# Patient Record
Sex: Female | Born: 1995 | Race: White | Hispanic: No | Marital: Single | Smoking: Former smoker
Health system: Southern US, Community
[De-identification: ages and names within clinical notes are randomized; demographics above are authoritative.]

## PROBLEM LIST (undated history)

## (undated) ENCOUNTER — Inpatient Hospital Stay (HOSPITAL_COMMUNITY): Payer: Self-pay

## (undated) DIAGNOSIS — IMO0002 Reserved for concepts with insufficient information to code with codable children: Secondary | ICD-10-CM

## (undated) DIAGNOSIS — F603 Borderline personality disorder: Secondary | ICD-10-CM

## (undated) DIAGNOSIS — Z915 Personal history of self-harm: Secondary | ICD-10-CM

## (undated) DIAGNOSIS — F419 Anxiety disorder, unspecified: Secondary | ICD-10-CM

## (undated) DIAGNOSIS — O139 Gestational [pregnancy-induced] hypertension without significant proteinuria, unspecified trimester: Secondary | ICD-10-CM

## (undated) DIAGNOSIS — Z8659 Personal history of other mental and behavioral disorders: Secondary | ICD-10-CM

## (undated) DIAGNOSIS — Z62810 Personal history of physical and sexual abuse in childhood: Secondary | ICD-10-CM

## (undated) DIAGNOSIS — F329 Major depressive disorder, single episode, unspecified: Secondary | ICD-10-CM

## (undated) DIAGNOSIS — Z87440 Personal history of urinary (tract) infections: Secondary | ICD-10-CM

## (undated) DIAGNOSIS — F32A Depression, unspecified: Secondary | ICD-10-CM

## (undated) DIAGNOSIS — N83209 Unspecified ovarian cyst, unspecified side: Secondary | ICD-10-CM

## (undated) DIAGNOSIS — Z87448 Personal history of other diseases of urinary system: Secondary | ICD-10-CM

## (undated) HISTORY — PX: NO PAST SURGERIES: SHX2092

## (undated) HISTORY — DX: Depression, unspecified: F32.A

## (undated) HISTORY — DX: Reserved for concepts with insufficient information to code with codable children: IMO0002

## (undated) HISTORY — PX: OTHER SURGICAL HISTORY: SHX169

## (undated) HISTORY — DX: Personal history of other diseases of urinary system: Z87.448

## (undated) HISTORY — DX: Personal history of other mental and behavioral disorders: Z86.59

## (undated) HISTORY — DX: Personal history of self-harm: Z91.5

## (undated) HISTORY — DX: Anxiety disorder, unspecified: F41.9

## (undated) HISTORY — DX: Personal history of urinary (tract) infections: Z87.440

## (undated) HISTORY — DX: Major depressive disorder, single episode, unspecified: F32.9

## (undated) HISTORY — DX: Gestational (pregnancy-induced) hypertension without significant proteinuria, unspecified trimester: O13.9

## (undated) HISTORY — DX: Borderline personality disorder: F60.3

## (undated) HISTORY — DX: Unspecified ovarian cyst, unspecified side: N83.209

---
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
## 2015-04-20 DIAGNOSIS — J208 Acute bronchitis due to other specified organisms: Secondary | ICD-10-CM | POA: Insufficient documentation

## 2015-04-20 DIAGNOSIS — J209 Acute bronchitis, unspecified: Secondary | ICD-10-CM | POA: Insufficient documentation

## 2016-02-28 ENCOUNTER — Encounter (HOSPITAL_COMMUNITY): Payer: Self-pay

## 2016-02-28 ENCOUNTER — Inpatient Hospital Stay (HOSPITAL_COMMUNITY): Payer: BLUE CROSS/BLUE SHIELD

## 2016-02-28 ENCOUNTER — Inpatient Hospital Stay (HOSPITAL_COMMUNITY)
Admission: AD | Admit: 2016-02-28 | Discharge: 2016-02-28 | Disposition: A | Discharge status: Home / Self Care | Payer: BLUE CROSS/BLUE SHIELD | Source: Ambulatory Visit | Attending: Obstetrics and Gynecology | Admitting: Obstetrics and Gynecology

## 2016-02-28 DIAGNOSIS — O200 Threatened abortion: Secondary | ICD-10-CM | POA: Insufficient documentation

## 2016-02-28 DIAGNOSIS — N939 Abnormal uterine and vaginal bleeding, unspecified: Secondary | ICD-10-CM | POA: Diagnosis present

## 2016-02-28 DIAGNOSIS — Z3A00 Weeks of gestation of pregnancy not specified: Secondary | ICD-10-CM | POA: Insufficient documentation

## 2016-02-28 DIAGNOSIS — O4690 Antepartum hemorrhage, unspecified, unspecified trimester: Secondary | ICD-10-CM

## 2016-02-28 LAB — HCG, QUANTITATIVE, PREGNANCY: HCG, BETA CHAIN, QUANT, S: 176 m[IU]/mL — AB (ref <5)

## 2016-02-28 LAB — POCT PREGNANCY, URINE: Preg Test, Ur: POSITIVE — AB

## 2016-02-28 LAB — URINALYSIS, ROUTINE W REFLEX MICROSCOPIC
Bilirubin Urine: NEGATIVE
Glucose, UA: NEGATIVE mg/dL
Ketones, ur: NEGATIVE mg/dL
NITRITE: NEGATIVE
Protein, ur: NEGATIVE mg/dL
SPECIFIC GRAVITY, URINE: 1.02 (ref 1.005–1.030)
pH: 5 (ref 5.0–8.0)

## 2016-02-28 NOTE — MAU Provider Note (Signed)
History   G1 early preg in with cramping and bleeding since yesterday. Denies any medical problems.  CSN: [id number]  Arrival date & time 02/28/16  1800   None     Chief Complaint  Patient presents with  . Vaginal Bleeding    HPI  No past medical history on file.  History reviewed. No pertinent surgical history.  Family History  Problem Relation Age of Onset  . Diabetes Father     Social History  Substance Use Topics  . Smoking status: Never Smoker  . Smokeless tobacco: Never Used  . Alcohol use No    OB History    Gravida Para Term Preterm AB Living   1             SAB TAB Ectopic Multiple Live Births                  Review of Systems  Constitutional: Negative.   HENT: Negative.   Eyes: Negative.   Respiratory: Negative.   Cardiovascular: Negative.   Gastrointestinal: Positive for abdominal pain.  Endocrine: Negative.   Genitourinary: Positive for vaginal bleeding.  Musculoskeletal: Negative.   Skin: Negative.   Allergic/Immunologic: Negative.   Neurological: Negative.   Hematological: Negative.   Psychiatric/Behavioral: Negative.     Allergies  Patient has no known allergies.  Home Medications    BP 122/56   Pulse 97   Temp 98.4 F (36.9 C) (Oral)   Resp 16   Ht 5\' 5"  (1.651 m)   Wt 192 lb (87.1 kg)   LMP 01/09/2016   BMI 31.95 kg/m   Physical Exam  Constitutional: She is oriented to person, place, and time. She appears well-developed and well-nourished.  HENT:  Head: Normocephalic.  Eyes: Pupils are equal, round, and reactive to light.  Neck: Normal range of motion.  Cardiovascular: Normal rate, regular rhythm, normal heart sounds and intact distal pulses.   Pulmonary/Chest: Effort normal and breath sounds normal.  Abdominal: Soft. Bowel sounds are normal.  Genitourinary: Vagina normal and uterus normal.  Musculoskeletal: Normal range of motion.  Neurological: She is alert and oriented to person, place, and time. She has normal  reflexes.  Skin: Skin is warm and dry.  Psychiatric: She has a normal mood and affect. Her behavior is normal. Judgment and thought content normal.    MAU Course  Procedures (including critical care time)  Labs Reviewed  POCT PREGNANCY, URINE - Abnormal; Notable for the following:       Result Value   Preg Test, Ur POSITIVE (*)    All other components within normal limits  URINALYSIS, ROUTINE W REFLEX MICROSCOPIC  HCG, QUANTITATIVE, PREGNANCY   No results found.   No diagnosis found.    MDM  Threaten abortion Scant amt bright vag bleeding. POC preg test pos. Quant 176 and vag probe us does not show definitive IUP but no ectopic noted at present. Will repeat quant Tuesday morning in clinic to assess viability. Will d/c home with ectopic anf threat ab precautions

## 2016-02-28 NOTE — MAU Note (Signed)
Had positive UPT 2 weeks ago, this morning started having bleeding like a period and passing clots having lower abdominal and lower back pain which she rates 6/10. LMP 01/09/16

## 2016-03-02 ENCOUNTER — Other Ambulatory Visit: Payer: BLUE CROSS/BLUE SHIELD

## 2016-03-03 ENCOUNTER — Other Ambulatory Visit: Payer: BLUE CROSS/BLUE SHIELD

## 2016-03-03 DIAGNOSIS — O039 Complete or unspecified spontaneous abortion without complication: Secondary | ICD-10-CM

## 2016-03-04 LAB — HCG, QUANTITATIVE, PREGNANCY: hCG, Beta Chain, Quant, S: 7.5 m[IU]/mL — ABNORMAL HIGH

## 2016-03-08 ENCOUNTER — Telehealth: Payer: Self-pay | Admitting: General Practice

## 2016-03-08 ENCOUNTER — Encounter: Payer: Self-pay | Admitting: General Practice

## 2016-03-08 NOTE — Telephone Encounter (Signed)
Per Dr Shawnie PonsPratt, bhcg confirms SAB. Called patient & informed her of results. Patient verbalized understanding & had no questions

## 2016-06-22 LAB — OB RESULTS CONSOLE ABO/RH: RH TYPE: POSITIVE

## 2016-06-22 LAB — OB RESULTS CONSOLE ANTIBODY SCREEN: Antibody Screen: NEGATIVE

## 2016-06-22 LAB — OB RESULTS CONSOLE RUBELLA ANTIBODY, IGM: Rubella: IMMUNE

## 2016-06-22 LAB — OB RESULTS CONSOLE GC/CHLAMYDIA
CHLAMYDIA, DNA PROBE: NEGATIVE
Gonorrhea: NEGATIVE

## 2016-06-22 LAB — OB RESULTS CONSOLE HIV ANTIBODY (ROUTINE TESTING): HIV: NONREACTIVE

## 2016-06-22 LAB — OB RESULTS CONSOLE RPR: RPR: NONREACTIVE

## 2016-06-22 LAB — OB RESULTS CONSOLE HEPATITIS B SURFACE ANTIGEN: HEP B S AG: NEGATIVE

## 2016-09-08 ENCOUNTER — Emergency Department (HOSPITAL_BASED_OUTPATIENT_CLINIC_OR_DEPARTMENT_OTHER)
Admission: EM | Admit: 2016-09-08 | Discharge: 2016-09-08 | Disposition: A | Discharge status: Home / Self Care | Payer: BLUE CROSS/BLUE SHIELD | Attending: Emergency Medicine | Admitting: Emergency Medicine

## 2016-09-08 ENCOUNTER — Encounter (HOSPITAL_BASED_OUTPATIENT_CLINIC_OR_DEPARTMENT_OTHER): Payer: Self-pay

## 2016-09-08 DIAGNOSIS — Y939 Activity, unspecified: Secondary | ICD-10-CM | POA: Insufficient documentation

## 2016-09-08 DIAGNOSIS — Y92000 Kitchen of unspecified non-institutional (private) residence as  the place of occurrence of the external cause: Secondary | ICD-10-CM | POA: Insufficient documentation

## 2016-09-08 DIAGNOSIS — S61211A Laceration without foreign body of left index finger without damage to nail, initial encounter: Secondary | ICD-10-CM | POA: Insufficient documentation

## 2016-09-08 DIAGNOSIS — W260XXA Contact with knife, initial encounter: Secondary | ICD-10-CM | POA: Diagnosis not present

## 2016-09-08 DIAGNOSIS — Y999 Unspecified external cause status: Secondary | ICD-10-CM | POA: Diagnosis not present

## 2016-09-08 DIAGNOSIS — S6992XA Unspecified injury of left wrist, hand and finger(s), initial encounter: Secondary | ICD-10-CM | POA: Diagnosis present

## 2016-09-08 MED ORDER — LIDOCAINE HCL (PF) 1 % IJ SOLN
5.0000 mL | Freq: Once | INTRAMUSCULAR | Status: DC
  Filled 2016-09-08: qty 5

## 2016-09-08 NOTE — ED Notes (Signed)
PMS intact before and after. Pt tolerated well. All questions answered. 

## 2016-09-08 NOTE — ED Triage Notes (Signed)
Pt states she cut left index finger with kitchen knife approx 15 min PTA-pt is [redacted] weeks pregnant

## 2016-09-08 NOTE — ED Provider Notes (Signed)
[facility] Provider Note   CSN: [id number] Arrival date & time: 09/08/16  2210  By signing my name below, I, Christina Freeman, attest that this documentation has been prepared under the direction and in the presence of [employer], PA-C. Electronically Signed: Linna Freeman, Scribe. 09/08/2016. 10:57 PM.  History   Chief Complaint Chief Complaint  Patient presents with  . Extremity Laceration   The history is provided by the patient. No language interpreter was used.    HPI Comments: Christina Freeman is a right-hand dominant 21 y.o. female who presents to the Emergency Department for evaluation of a left index finger laceration sustained around 930 PM tonight. She was cutting sunflower stems and inadvertently lacerated her left index finger with the knife. She endorses mild, throbbing pain secondary to the wound. The wound is hemostatic with an applied pressure dressing. No medications or treatments tried PTA. She is unsure of her tetanus status. She denies numbness/tingling, focal weakness, or any other associated symptoms. Patient is [redacted] weeks pregnant.   History reviewed. No pertinent past medical history.  There are no active problems to display for this patient.   History reviewed. No pertinent surgical history.  OB History    Gravida Para Term Preterm AB Living   2             SAB TAB Ectopic Multiple Live Births                   Home Medications    Prior to Admission medications   Not on File    Family History Family History  Problem Relation Age of Onset  . Diabetes Father     Social History Social History  Substance Use Topics  . Smoking status: Never Smoker  . Smokeless tobacco: Never Used  . Alcohol use No     Allergies   Patient has no known allergies.   Review of Systems Review of Systems  Skin: Positive for wound.  Neurological: Negative for weakness and numbness.  All other systems reviewed and are negative.  Physical  Exam Updated Vital Signs BP 118/74 (BP Location: Right Arm)   Pulse 88   Temp 98.3 F (36.8 C) (Oral)   Resp 20   Wt 91.1 kg (200 lb 13.4 oz)   LMP 01/09/2016   SpO2 100%   BMI 33.42 kg/m   Physical Exam  Constitutional: She appears well-developed and well-nourished.  HENT:  Head: Normocephalic and atraumatic.  Right Ear: External ear normal.  Left Ear: External ear normal.  Eyes: Conjunctivae are normal. Right eye exhibits no discharge. Left eye exhibits no discharge. No scleral icterus.  Cardiovascular:  Pulses:      Radial pulses are 2+ on the left side.  Pulmonary/Chest: Effort normal. No respiratory distress.  Musculoskeletal:       Left wrist: Normal.       Left hand: She exhibits normal range of motion and normal capillary refill. Normal sensation noted. Decreased sensation is not present in the ulnar distribution, is not present in the medial redistribution and is not present in the radial distribution. Normal strength noted.  Neurological: She is alert.  Skin: No pallor.  Left hand, second digit: There is a 1.5 cm incision over the PIP. Sensation is intact distally. 2+ radial pulse.  Psychiatric: She has a normal mood and affect.  Nursing note and vitals reviewed.  ED Treatments / Results  Labs (all labs ordered are listed, but only abnormal results are displayed) Labs  Reviewed - No data to display  EKG  EKG Interpretation None       Radiology No results found.  Procedures .Marland Kitchen.Laceration Repair Date/Time: 09/08/2016 11:32 PM Performed by: Jacinto HalimMACZIS, Terea Neubauer M Authorized by: Jacinto HalimMACZIS, Chabely Norby M   Consent:    Consent obtained:  Verbal   Consent given by:  Patient   Risks discussed:  Infection, need for additional repair, nerve damage, poor wound healing, poor cosmetic result, pain, retained foreign body, tendon damage and vascular damage   Alternatives discussed:  No treatment Anesthesia (see MAR for exact dosages):    Anesthesia method:  None Laceration  details:    Location:  Finger   Finger location:  L index finger   Length (cm):  1.5   Laceration depth: superficial  Repair type:    Repair type:  Simple Pre-procedure details:    Preparation:  Patient was prepped and draped in usual sterile fashion Exploration:    Hemostasis achieved with:  Direct pressure   Wound exploration: wound explored through full range of motion     Contaminated: no   Treatment:    Area cleansed with:  Saline   Amount of cleaning:  Standard   Irrigation solution:  Sterile saline   Irrigation volume:  50   Irrigation method:  Syringe   Visualized foreign bodies/material removed: no   Skin repair:    Repair method:  Tissue adhesive and Steri-Strips   Number of Steri-Strips:  3 Post-procedure details:    Dressing:  Splint for protection   Patient tolerance of procedure:  Tolerated well, no immediate complications   (including critical care time)  DIAGNOSTIC STUDIES: Oxygen Saturation is 100% on RA, normal by my interpretation.    COORDINATION OF CARE: 10:57 PM Discussed treatment plan with pt at bedside and pt agreed to plan.  Medications Ordered in ED Medications  lidocaine (PF) (XYLOCAINE) 1 % injection 5 mL (5 mLs Infiltration Not Given 09/08/16 2325)     Initial Impression / Assessment and Plan / ED Course  I have reviewed the triage vital signs and the nursing notes.  Pertinent labs & imaging results that were available during my care of the patient were reviewed by me and considered in my medical decision making (see chart for details).     Pressure irrigation performed. Wound explored and base of wound visualized in a bloodless field without evidence of foreign body.  Laceration superficial. Dermabond and steristrips applied. Fingersplint given for application. Laceration occurred < 8 hours prior to repair which was well tolerated. Patient wishes to wait for tetanus shot until she sees her OB/GYN.  Pt has no comorbidities to effect normal  wound healing. Pt discharged without antibiotics.  Discussed dermabond home care with patient and answered questions. Pt to follow-up for wound check in 7 days; they are to return to the ED sooner for signs of infection. Pt is hemodynamically stable with no complaints prior to dc.    Final Clinical Impressions(s) / ED Diagnoses   Final diagnoses:  Laceration of left index finger without foreign body without damage to nail, initial encounter    New Prescriptions There are no discharge medications for this patient.  I personally performed the services described in this documentation, which was scribed in my presence. The recorded information has been reviewed and is accurate.     Jacinto HalimMaczis, Fabian Walder M, PA-C 09/08/16 2351    Lavera GuiseLiu, Dana Duo, MD 09/09/16 0001

## 2016-09-08 NOTE — ED Notes (Signed)
ED Provider at bedside. 

## 2016-09-08 NOTE — Discharge Instructions (Signed)
Your laceration has been repaired with dermabond. The film will usually remain in place for 5-10 days, then naturally fall off your skin. Keep the bandaging dry. Replace the dressing daily until the adhesive film has fallen off or if the bandage should become wet. When changing the dressing, do not apply tape directly over the dermabond adhesive film as removing the tape later may also remove the film. Do not apply topical liquids or ointments to the area while the dermabond is in place. This may loosen the film. You may occasionally breifely wet your wound in a shower or bath. Do not soak or scrub your wound. Do not swim. Avoid periods of heavy perspiration. After showering, gently blot your wound dry with a soft towel and apply new clean bandage. Protect your wound from injury. Do not scratch, rub or pick at the Dermabond film.   SEEK MEDICAL CARE IF:  You have redness, swelling, or increasing pain in the wound.  You see a red line that goes away from the wound.  You have yellowish-white fluid (pus) coming from the wound.  You have a fever.  You notice a bad smell coming from the wound or dressing.  Your wound breaks open before or after sutures have been removed.  You notice something coming out of the wound such as wood or glass.  Your wound is on your hand or foot and you cannot move a finger or toe.  Your pain is not controlled with prescribed medicine.   If you did not receive a tetanus shot today because you thought you were up to date, but did not recall when your last one was given, make sure to check with your primary caregiver/OBGYN to determine if you need one.

## 2016-12-11 ENCOUNTER — Encounter (HOSPITAL_COMMUNITY): Payer: Self-pay

## 2016-12-11 ENCOUNTER — Inpatient Hospital Stay (HOSPITAL_COMMUNITY)
Admission: AD | Admit: 2016-12-11 | Discharge: 2016-12-11 | Disposition: A | Discharge status: Home / Self Care | Payer: BLUE CROSS/BLUE SHIELD | Source: Ambulatory Visit | Attending: Obstetrics and Gynecology | Admitting: Obstetrics and Gynecology

## 2016-12-11 DIAGNOSIS — Z8659 Personal history of other mental and behavioral disorders: Secondary | ICD-10-CM

## 2016-12-11 DIAGNOSIS — Z3A32 32 weeks gestation of pregnancy: Secondary | ICD-10-CM | POA: Insufficient documentation

## 2016-12-11 DIAGNOSIS — O26893 Other specified pregnancy related conditions, third trimester: Secondary | ICD-10-CM | POA: Diagnosis not present

## 2016-12-11 DIAGNOSIS — R102 Pelvic and perineal pain: Secondary | ICD-10-CM | POA: Diagnosis not present

## 2016-12-11 DIAGNOSIS — O26899 Other specified pregnancy related conditions, unspecified trimester: Secondary | ICD-10-CM

## 2016-12-11 DIAGNOSIS — N898 Other specified noninflammatory disorders of vagina: Secondary | ICD-10-CM

## 2016-12-11 DIAGNOSIS — O212 Late vomiting of pregnancy: Secondary | ICD-10-CM | POA: Insufficient documentation

## 2016-12-11 DIAGNOSIS — R112 Nausea with vomiting, unspecified: Secondary | ICD-10-CM

## 2016-12-11 LAB — URINALYSIS, ROUTINE W REFLEX MICROSCOPIC
Bilirubin Urine: NEGATIVE
GLUCOSE, UA: NEGATIVE mg/dL
Hgb urine dipstick: NEGATIVE
KETONES UR: NEGATIVE mg/dL
LEUKOCYTES UA: NEGATIVE
Nitrite: NEGATIVE
PROTEIN: NEGATIVE mg/dL
Specific Gravity, Urine: 1.016 (ref 1.005–1.030)
pH: 6 (ref 5.0–8.0)

## 2016-12-11 LAB — WET PREP, GENITAL
Clue Cells Wet Prep HPF POC: NONE SEEN
Sperm: NONE SEEN
Trich, Wet Prep: NONE SEEN
YEAST WET PREP: NONE SEEN

## 2016-12-11 LAB — POCT FERN TEST: POCT Fern Test: NEGATIVE

## 2016-12-11 MED ORDER — PROMETHAZINE HCL 25 MG PO TABS
25.0000 mg | ORAL_TABLET | Freq: Once | ORAL | Status: AC
  Administered 2016-12-11: 25 mg via ORAL
  Filled 2016-12-11: qty 1

## 2016-12-11 MED ORDER — PROMETHAZINE HCL 25 MG PO TABS: 25.0000 mg | ORAL_TABLET | Freq: Four times a day (QID) | ORAL | 2 refills | Status: DC | PRN

## 2016-12-11 NOTE — MAU Note (Signed)
Patient presents with vaginal pain, liquid discharge clear, denies itching or burning, vomiting x 2 today around 12:30

## 2016-12-11 NOTE — Discharge Instructions (Signed)
Preterm Labor and Birth Information The normal length of a pregnancy is 39-41 weeks. Preterm labor is when labor starts before 37 completed weeks of pregnancy. What are the risk factors for preterm labor? Preterm labor is more likely to occur in women who:  Have certain infections during pregnancy such as a bladder infection, sexually transmitted infection, or infection inside the uterus (chorioamnionitis).  Have a shorter-than-normal cervix.  Have gone into preterm labor before.  Have had surgery on their cervix.  Are younger than age 89 or older than age 19.  Are African American.  Are pregnant with twins or multiple babies (multiple gestation).  Take street drugs or smoke while pregnant.  Do not gain enough weight while pregnant.  Became pregnant shortly after having been pregnant.  What are the symptoms of preterm labor? Symptoms of preterm labor include:  Cramps similar to those that can happen during a menstrual period. The cramps may happen with diarrhea.  Pain in the abdomen or lower back.  Regular uterine contractions that may feel like tightening of the abdomen.  A feeling of increased pressure in the pelvis.  Increased watery or bloody mucus discharge from the vagina.  Water breaking (ruptured amniotic sac).  Why is it important to recognize signs of preterm labor? It is important to recognize signs of preterm labor because babies who are born prematurely may not be fully developed. This can put them at an increased risk for:  Long-term (chronic) heart and lung problems.  Difficulty immediately after birth with regulating body systems, including blood sugar, body temperature, heart rate, and breathing rate.  Bleeding in the brain.  Cerebral palsy.  Learning difficulties.  Death.  These risks are highest for babies who are born before 37 weeks of pregnancy. How is preterm labor treated? Treatment depends on the length of your pregnancy, your  condition, and the health of your baby. It may involve:  Having a stitch (suture) placed in your cervix to prevent your cervix from opening too early (cerclage).  Taking or being given medicines, such as: ? Hormone medicines. These may be given early in pregnancy to help support the pregnancy. ? Medicine to stop contractions. ? Medicines to help mature the babys lungs. These may be prescribed if the risk of delivery is high. ? Medicines to prevent your baby from developing cerebral palsy.  If the labor happens before 34 weeks of pregnancy, you may need to stay in the hospital. What should I do if I think I am in preterm labor? If you think that you are going into preterm labor, call your health care provider right away. How can I prevent preterm labor in future pregnancies? To increase your chance of having a full-term pregnancy:  Do not use any tobacco products, such as cigarettes, chewing tobacco, and e-cigarettes. If you need help quitting, ask your health care provider.  Do not use street drugs or medicines that have not been prescribed to you during your pregnancy.  Talk with your health care provider before taking any herbal supplements, even if you have been taking them regularly.  Make sure you gain a healthy amount of weight during your pregnancy.  Watch for infection. If you think that you might have an infection, get it checked right away.  Make sure to tell your health care provider if you have gone into preterm labor before.  This information is not intended to replace advice given to you by your health care provider. Make sure you discuss any questions  you have with your health care provider. Document Released: 05/01/2003 Document Revised: 07/22/2015 Document Reviewed: 07/02/2015 Elsevier Interactive Patient Education  2018 [employer] Nausea and Vomiting, Adult Nausea is the feeling that you have an upset stomach or have to vomit. As nausea gets worse, it can lead  to vomiting. Vomiting occurs when stomach contents are thrown up and out of the mouth. Vomiting can make you feel weak and cause you to become dehydrated. Dehydration can make you tired and thirsty, cause you to have a dry mouth, and decrease how often you urinate. Older adults and people with other diseases or a weak immune system are at higher risk for dehydration. It is important to treat your nausea and vomiting as told by your health care provider. Follow these instructions at home: Follow instructions from your health care provider about how to care for yourself at home. Eating and drinking Follow these recommendations as told by your health care provider:  Take an oral rehydration solution (ORS). This is a drink that is sold at pharmacies and retail stores.  Drink clear fluids in small amounts as you are able. Clear fluids include water, ice chips, diluted fruit juice, and low-calorie sports drinks.  Eat bland, easy-to-digest foods in small amounts as you are able. These foods include bananas, applesauce, rice, lean meats, toast, and crackers.  Avoid fluids that contain a lot of sugar or caffeine, such as energy drinks, sports drinks, and soda.  Avoid alcohol.  Avoid spicy or fatty foods.  General instructions  Drink enough fluid to keep your urine clear or pale yellow.  Wash your hands often. If soap and water are not available, use hand sanitizer.  Make sure that all people in your household wash their hands well and often.  Take over-the-counter and prescription medicines only as told by your health care provider.  Rest at home while you recover.  Watch your condition for any changes.  Breathe slowly and deeply when you feel nauseated.  Keep all follow-up visits as told by your health care provider. This is important. Contact a health care provider if:  You have a fever.  You cannot keep fluids down.  Your symptoms get worse.  You have new symptoms.  Your nausea  does not go away after two days.  You feel light-headed or dizzy.  You have a headache.  You have muscle cramps. Get help right away if:  You have pain in your chest, neck, arm, or jaw.  You feel extremely weak or you faint.  You have persistent vomiting.  You see blood in your vomit.  Your vomit looks like black coffee grounds.  You have bloody or black stools or stools that look like tar.  You have a severe headache, a stiff neck, or both.  You have a rash.  You have severe pain, cramping, or bloating in your abdomen.  You have trouble breathing or you are breathing very quickly.  Your heart is beating very quickly.  Your skin feels cold and clammy.  You feel confused.  You have pain when you urinate.  You have signs of dehydration, such as: ? Dark urine, very little urine, or no urine. ? Cracked lips. ? Dry mouth. ? Sunken eyes. ? Sleepiness. ? Weakness. These symptoms may represent a serious problem that is an emergency. Do not wait to see if the symptoms will go away. Get medical help right away. Call your local emergency services (911 in the [place] Do not drive yourself to  the hospital. This information is not intended to replace advice given to you by your health care provider. Make sure you discuss any questions you have with your health care provider. Document Released: 02/08/2005 Document Revised: 07/14/2015 Document Reviewed: 10/15/2014 Elsevier Interactive Patient Education  2017 [employer].

## 2016-12-11 NOTE — MAU Provider Note (Signed)
Chief Complaint:  Vaginal Discharge; Emesis; and Abdominal Pain   First Provider Initiated Contact with Patient 12/11/16 1423     HPI: Christina Freeman is a 21 y.o. G2P0 at [id number] presents to maternity admissions reporting vaginal pressure, clear liquid vaginal discharge, and two episodes of vomiting today.  Has not had vomiting since first trimester  Some intermittent pelvic cramps with tightening. She reports good fetal movement, denies LOF, vaginal bleeding, vaginal itching/burning, urinary symptoms, h/a, dizziness, diarrhea, constipation or fever/chills.    Vaginal Discharge  The patient's primary symptoms include vaginal discharge. The patient's pertinent negatives include no genital itching, genital lesions, genital odor, pelvic pain or vaginal bleeding. This is a new problem. The current episode started today. The problem occurs constantly. The problem has been unchanged. The patient is experiencing no pain. Associated symptoms include abdominal pain and vomiting. Pertinent negatives include no chills, constipation, diarrhea, fever or headaches. The vaginal discharge was clear and watery. There has been no bleeding. She has not been passing clots. She has not been passing tissue. Nothing aggravates the symptoms. She has tried nothing for the symptoms.  Emesis   This is a new problem. The current episode started today. The problem occurs less than 2 times per day. The problem has been resolved. Associated symptoms include abdominal pain. Pertinent negatives include no chest pain, chills, diarrhea, dizziness, fever or headaches. She has tried nothing for the symptoms.  Abdominal Pain  This is a new problem. The current episode started today. The onset quality is gradual. Episode frequency: not really pain, just pressure. The pain is located in the suprapubic region. The patient is experiencing no pain. The quality of the pain is dull. The abdominal pain does not radiate. Associated symptoms include  vomiting. Pertinent negatives include no constipation, diarrhea, fever or headaches. Nothing aggravates the pain. The pain is relieved by nothing. She has tried nothing for the symptoms.    RN Note: Patient presents with vaginal pain, liquid discharge clear, denies itching or burning, vomiting x 2 today around 12:30   Past Medical History: No past medical history on file.  Past obstetric history: OB History  Gravida Para Term Preterm AB Living  2            SAB TAB Ectopic Multiple Live Births               # Outcome Date GA Lbr Len/2nd Weight Sex Delivery Anes PTL Lv  2 Current           1 Gravida               Past Surgical History: No past surgical history on file.  Family History: Family History  Problem Relation Age of Onset  . Diabetes Father     Social History: Social History  Substance Use Topics  . Smoking status: Never Smoker  . Smokeless tobacco: Never Used  . Alcohol use No    Allergies: No Known Allergies  Meds:  No prescriptions prior to admission.    I have reviewed patient's Past Medical Hx, Surgical Hx, Family Hx, Social Hx, medications and allergies.   ROS:  Review of Systems  Constitutional: Negative for chills and fever.  Cardiovascular: Negative for chest pain.  Gastrointestinal: Positive for abdominal pain and vomiting. Negative for constipation and diarrhea.  Genitourinary: Positive for vaginal discharge. Negative for pelvic pain.  Neurological: Negative for dizziness and headaches.   Other systems negative  Physical Exam  Patient Vitals for the past 24  hrs:  BP Temp Pulse Resp Height Weight  12/11/16 1414 136/84 98.3 F (36.8 C) (!) 119 18 5\' 5"  (1.651 m) 223 lb (101.2 kg)   Constitutional: Well-developed, well-nourished female in no acute distress.  Cardiovascular: normal rate and rhythm Respiratory: normal effort, clear to auscultation bilaterally GI: Abd soft, non-tender, gravid appropriate for gestational age.   No rebound  or guarding. MS: Extremities nontender, no edema, normal ROM Neurologic: Alert and oriented x 4.  GU: Neg CVAT.  PELVIC EXAM: Cervix pink, visually closed, without lesion, scant white creamy discharge, vaginal walls and external genitalia normal Bimanual exam: Cervix firm, posterior, neg CMT, uterus nontender, Fundal Height consistent with dates, adnexa without tenderness, enlargement, or mass   Cervix closed/60-70/Ballotable    FHT:  Baseline 140 , moderate variability, accelerations present, no decelerations Contractions: Rare   Labs: Results for orders placed or performed during the hospital encounter of 12/11/16 (from the past 24 hour(s))  Urinalysis, Routine w reflex microscopic     Status: None   Collection Time: 12/11/16  2:17 PM  Result Value Ref Range   Color, Urine YELLOW YELLOW   APPearance CLEAR CLEAR   Specific Gravity, Urine 1.016 1.005 - 1.030   pH 6.0 5.0 - 8.0   Glucose, UA NEGATIVE NEGATIVE mg/dL   Hgb urine dipstick NEGATIVE NEGATIVE   Bilirubin Urine NEGATIVE NEGATIVE   Ketones, ur NEGATIVE NEGATIVE mg/dL   Protein, ur NEGATIVE NEGATIVE mg/dL   Nitrite NEGATIVE NEGATIVE   Leukocytes, UA NEGATIVE NEGATIVE  Wet prep, genital     Status: Abnormal   Collection Time: 12/11/16  2:43 PM  Result Value Ref Range   Yeast Wet Prep HPF POC NONE SEEN NONE SEEN   Trich, Wet Prep NONE SEEN NONE SEEN   Clue Cells Wet Prep HPF POC NONE SEEN NONE SEEN   WBC, Wet Prep HPF POC FEW (A) NONE SEEN   Sperm NONE SEEN   Fern Test     Status: None   Collection Time: 12/11/16  2:55 PM  Result Value Ref Range   POCT Fern Test Negative = intact amniotic membranes     Imaging:  No results found.  MAU Course/MDM: I have ordered labs and reviewed results. Urine is dilute, not suggestive of dehydration. Wet prep negative. Fern negative NST reviewed and found reactive, Category I Consult Dr Vincente Poli with presentation, exam findings and test results.  Treatments in MAU included EFM  and Phenergan, no vomiting here    Assessment: Single IUP at [redacted]w[redacted]d Pelvic pressure Vaginal discharge, physiologic Vomited twice, possible gastroenteritis  Plan: Discharge home Advance diet as tolerated Reassured Preterm Labor precautions and fetal kick counts Follow up in Office for prenatal visits and recheck of status Encouraged to return here or to other Urgent Care/ED if she develops worsening of symptoms, increase in pain, fever, or other concerning symptoms.   Pt stable at time of discharge.  Wynelle Bourgeois CNM, MSN Certified Nurse-Midwife 12/11/2016 2:24 PM

## 2017-01-01 ENCOUNTER — Encounter (HOSPITAL_COMMUNITY): Payer: Self-pay | Admitting: *Deleted

## 2017-01-01 ENCOUNTER — Inpatient Hospital Stay (HOSPITAL_COMMUNITY)
Admission: AD | Admit: 2017-01-01 | Discharge: 2017-01-01 | Disposition: A | Discharge status: Home / Self Care | Payer: BLUE CROSS/BLUE SHIELD | Source: Ambulatory Visit | Attending: Obstetrics and Gynecology | Admitting: Obstetrics and Gynecology

## 2017-01-01 DIAGNOSIS — Z3A15 15 weeks gestation of pregnancy: Secondary | ICD-10-CM

## 2017-01-01 DIAGNOSIS — Z3A35 35 weeks gestation of pregnancy: Secondary | ICD-10-CM | POA: Insufficient documentation

## 2017-01-01 DIAGNOSIS — O133 Gestational [pregnancy-induced] hypertension without significant proteinuria, third trimester: Secondary | ICD-10-CM | POA: Insufficient documentation

## 2017-01-01 DIAGNOSIS — H538 Other visual disturbances: Secondary | ICD-10-CM | POA: Diagnosis present

## 2017-01-01 LAB — COMPREHENSIVE METABOLIC PANEL
ALT: 10 U/L — ABNORMAL LOW (ref 14–54)
AST: 16 U/L (ref 15–41)
Albumin: 2.8 g/dL — ABNORMAL LOW (ref 3.5–5.0)
Alkaline Phosphatase: 113 U/L (ref 38–126)
Anion gap: 9 (ref 5–15)
BUN: 8 mg/dL (ref 6–20)
CHLORIDE: 105 mmol/L (ref 101–111)
CO2: 22 mmol/L (ref 22–32)
Calcium: 8.5 mg/dL — ABNORMAL LOW (ref 8.9–10.3)
Creatinine, Ser: 0.56 mg/dL (ref 0.44–1.00)
GFR calc Af Amer: >60 mL/min (ref >60)
GFR calc non Af Amer: >60 mL/min (ref >60)
GLUCOSE: 99 mg/dL (ref 65–99)
Potassium: 3.9 mmol/L (ref 3.5–5.1)
Sodium: 136 mmol/L (ref 135–145)
Total Bilirubin: 0.2 mg/dL — ABNORMAL LOW (ref 0.3–1.2)
Total Protein: 6.7 g/dL (ref 6.5–8.1)

## 2017-01-01 LAB — URINALYSIS, ROUTINE W REFLEX MICROSCOPIC
BILIRUBIN URINE: NEGATIVE
Glucose, UA: NEGATIVE mg/dL
Hgb urine dipstick: NEGATIVE
Ketones, ur: NEGATIVE mg/dL
LEUKOCYTES UA: NEGATIVE
NITRITE: NEGATIVE
PH: 6 (ref 5.0–8.0)
Protein, ur: NEGATIVE mg/dL
Specific Gravity, Urine: 1.013 (ref 1.005–1.030)

## 2017-01-01 LAB — PROTEIN / CREATININE RATIO, URINE
Creatinine, Urine: 81 mg/dL
PROTEIN CREATININE RATIO: 0.1 mg/mg{creat} (ref 0.00–0.15)
Total Protein, Urine: 8 mg/dL

## 2017-01-01 LAB — CBC
HCT: 37.6 % (ref 36.0–46.0)
HEMOGLOBIN: 11.9 g/dL — AB (ref 12.0–15.0)
MCH: 28 pg (ref 26.0–34.0)
MCHC: 31.6 g/dL (ref 30.0–36.0)
MCV: 88.5 fL (ref 78.0–100.0)
PLATELETS: 256 10*3/uL (ref 150–400)
RBC: 4.25 MIL/uL (ref 3.87–5.11)
RDW: 14.9 % (ref 11.5–15.5)
WBC: 9.7 10*3/uL (ref 4.0–10.5)

## 2017-01-01 MED ORDER — NIFEDIPINE 10 MG PO CAPS
10.0000 mg | ORAL_CAPSULE | ORAL | Status: DC | PRN
  Administered 2017-01-01: 10 mg via ORAL
  Filled 2017-01-01: qty 1

## 2017-01-01 MED ORDER — LABETALOL HCL 5 MG/ML IV SOLN: 40.0000 mg | Freq: Once | INTRAVENOUS | Status: DC | PRN

## 2017-01-01 MED ORDER — HYDRALAZINE HCL 20 MG/ML IJ SOLN: 10.0000 mg | Freq: Once | INTRAMUSCULAR | Status: DC | PRN

## 2017-01-01 MED ORDER — LABETALOL HCL 5 MG/ML IV SOLN: 20.0000 mg | INTRAVENOUS | Status: DC | PRN

## 2017-01-01 MED ORDER — ACETAMINOPHEN 325 MG PO TABS
650.0000 mg | ORAL_TABLET | Freq: Once | ORAL | Status: AC
  Administered 2017-01-01: 650 mg via ORAL
  Filled 2017-01-01: qty 2

## 2017-01-01 NOTE — MAU Note (Signed)
Pt had prenatal appointment on Thursday, Her blood pressure was elevated. They drew lab work and gave her s/s to look for. She is c/o now of headache, blurred vision, SOB and nausea. Also c/o abd pain (contractions). Fetal movement a little less than normal. Has not taken anything for headache.

## 2017-01-01 NOTE — Discharge Instructions (Signed)
Hypertension During Pregnancy °Hypertension, commonly called high blood pressure, is when the force of blood pumping through your arteries is too strong. Arteries are blood vessels that carry blood from the heart throughout the body. Hypertension during pregnancy can cause problems for you and your baby. Your baby may be born early (prematurely) or may not weigh as much as he or she should at birth. Very bad cases of hypertension during pregnancy can be life-threatening. °Different types of hypertension can occur during pregnancy. These include: °· Chronic hypertension. This happens when: °? You have hypertension before pregnancy and it continues during pregnancy. °? You develop hypertension before you are [redacted] weeks pregnant, and it continues during pregnancy. °· Gestational hypertension. This is hypertension that develops after the 20th week of pregnancy. °· Preeclampsia, also called toxemia of pregnancy. This is a very serious type of hypertension that develops only during pregnancy. It affects the whole body, and it can be very dangerous for you and your baby. ° °Gestational hypertension and preeclampsia usually go away within 6 weeks after your baby is born. Women who have hypertension during pregnancy have a greater chance of developing hypertension later in life or during future pregnancies. °What are the causes? °The exact cause of hypertension is not known. °What increases the risk? °There are certain factors that make it more likely for you to develop hypertension during pregnancy. These include: °· Having hypertension during a previous pregnancy or prior to pregnancy. °· Being overweight. °· Being older than age 40. °· Being pregnant for the first time or being pregnant with more than one baby. °· Becoming pregnant using fertilization methods such as IVF (in vitro fertilization). °· Having diabetes, kidney problems, or systemic lupus erythematosus. °· Having a family history of hypertension. ° °What are the  signs or symptoms? °Chronic hypertension and gestational hypertension rarely cause symptoms. Preeclampsia causes symptoms, which may include: °· Increased protein in your urine. Your health care provider will check for this at every visit before you give birth (prenatal visit). °· Severe headaches. °· Sudden weight gain. °· Swelling of the hands, face, legs, and feet. °· Nausea and vomiting. °· Vision problems, such as blurred or double vision. °· Numbness in the face, arms, legs, and feet. °· Dizziness. °· Slurred speech. °· Sensitivity to bright lights. °· Abdominal pain. °· Convulsions. ° °How is this diagnosed? °You may be diagnosed with hypertension during a routine prenatal exam. At each prenatal visit, you may: °· Have a urine test to check for high amounts of protein in your urine. °· Have your blood pressure checked. A blood pressure reading is recorded as two numbers, such as "120 over 80" (or 120/80). The first ("top") number is called the systolic pressure. It is a measure of the pressure in your arteries when your heart beats. The second ("bottom") number is called the diastolic pressure. It is a measure of the pressure in your arteries as your heart relaxes between beats. Blood pressure is measured in a unit called mm Hg. A normal blood pressure reading is: °? Systolic: below 120. °? Diastolic: below 80. ° °The type of hypertension that you are diagnosed with depends on your test results and when your symptoms developed. °· Chronic hypertension is usually diagnosed before 20 weeks of pregnancy. °· Gestational hypertension is usually diagnosed after 20 weeks of pregnancy. °· Hypertension with high amounts of protein in the urine is diagnosed as preeclampsia. °· Blood pressure measurements that stay above 160 systolic, or above 110 diastolic, are   signs of severe preeclampsia. ° °How is this treated? °Treatment for hypertension during pregnancy varies depending on the type of hypertension you have and how  serious it is. °· If you take medicines called ACE inhibitors to treat chronic hypertension, you may need to switch medicines. ACE inhibitors should not be taken during pregnancy. °· If you have gestational hypertension, you may need to take blood pressure medicine. °· If you are at risk for preeclampsia, your health care provider may recommend that you take a low-dose aspirin every day to prevent high blood pressure during your pregnancy. °· If you have severe preeclampsia, you may need to be hospitalized so you and your baby can be monitored closely. You may also need to take medicine (magnesium sulfate) to prevent seizures and to lower blood pressure. This medicine may be given as an injection or through an IV tube. °· In some cases, if your condition gets worse, you may need to deliver your baby early. ° °Follow these instructions at home: °Eating and drinking °· Drink enough fluid to keep your urine clear or pale yellow. °· Eat a healthy diet that is low in salt (sodium). Do not add salt to your food. Check food labels to see how much sodium a food or beverage contains. °Lifestyle °· Do not use any products that contain nicotine or tobacco, such as cigarettes and e-cigarettes. If you need help quitting, ask your health care provider. °· Do not use alcohol. °· Avoid caffeine. °· Avoid stress as much as possible. Rest and get plenty of sleep. °General instructions °· Take over-the-counter and prescription medicines only as told by your health care provider. °· While lying down, lie on your left side. This keeps pressure off your baby. °· While sitting or lying down, raise (elevate) your feet. Try putting some pillows under your lower legs. °· Exercise regularly. Ask your health care provider what kinds of exercise are best for you. °· Keep all prenatal and follow-up visits as told by your health care provider. This is important. °Contact a health care provider if: °· You have symptoms that your health care  provider told you may require more treatment or monitoring, such as: °? Fever. °? Vomiting. °? Headache. °Get help right away if: °· You have severe abdominal pain or vomiting that does not get better with treatment. °· You suddenly develop swelling in your hands, ankles, or face. °· You gain 4 lbs (1.8 kg) or more in 1 week. °· You develop vaginal bleeding, or you have blood in your urine. °· You do not feel your baby moving as much as usual. °· You have blurred or double vision. °· You have muscle twitching or sudden tightening (spasms). °· You have shortness of breath. °· Your lips or fingernails turn blue. °This information is not intended to replace advice given to you by your health care provider. Make sure you discuss any questions you have with your health care provider. °Document Released: 10/27/2010 Document Revised: 08/29/2015 Document Reviewed: 07/25/2015 °Elsevier Interactive Patient Education © 2018 [employer] ° °

## 2017-01-01 NOTE — MAU Provider Note (Signed)
Chief Complaint:  Headache; Blurred Vision; Shortness of Breath; and Abdominal Pain   First Provider Initiated Contact with Patient 01/01/17 1654     HPI: Christina Freeman is a 21 y.o. G2P0010 at 4835w6dwho presents to maternity admissions reporting hypertension, headache since Thursday, and blurred vision since yesterday.  Has had RUQ pain since 25 weeks.  .She reports good fetal movement, denies LOF, vaginal bleeding, vaginal itching/burning, urinary symptoms, h/a, dizziness, n/v, diarrhea, constipation or fever/chills.    Headache   This is a recurrent problem. The current episode started in the past 7 days. The problem occurs constantly. The problem has been unchanged. The quality of the pain is described as aching. Associated symptoms include abdominal pain and blurred vision. Pertinent negatives include no back pain, dizziness, fever, nausea or photophobia. Nothing aggravates the symptoms. She has tried nothing for the symptoms.  Abdominal Pain  This is a recurrent problem. The current episode started more than 1 month ago. The onset quality is gradual. The problem occurs intermittently. The pain is located in the RUQ. Pertinent negatives include no constipation, diarrhea, dysuria, fever, myalgias or nausea. Nothing aggravates the pain. The pain is relieved by nothing. She has tried nothing for the symptoms.    RN Note: Pt had prenatal appointment on Thursday, Her blood pressure was elevated. They drew lab work and gave her s/s to look for. She is c/o now of headache, blurred vision, SOB and nausea. Also c/o abd pain (contractions). Fetal movement a little less than normal. Has not taken anything for headache.    Past Medical History: History reviewed. No pertinent past medical history.  Past obstetric history: OB History  Gravida Para Term Preterm AB Living  2       1    SAB TAB Ectopic Multiple Live Births  1            # Outcome Date GA Lbr Len/2nd Weight Sex Delivery Anes PTL Lv  2  Current           1 SAB 03/13/16              Past Surgical History: Past Surgical History:  Procedure Laterality Date  . NO PAST SURGERIES    . tubes in ears      Family History: Family History  Problem Relation Age of Onset  . Diabetes Father     Social History: Social History   Tobacco Use  . Smoking status: Never Smoker  . Smokeless tobacco: Never Used  Substance Use Topics  . Alcohol use: No  . Drug use: No    Allergies: No Known Allergies  Meds:  Medications Prior to Admission  Medication Sig Dispense Refill Last Dose  . calcium carbonate (TUMS - DOSED IN MG ELEMENTAL CALCIUM) 500 MG chewable tablet Chew 2 tablets 2 (two) times daily as needed by mouth for indigestion or heartburn.   Past Week at Unknown time  . promethazine (PHENERGAN) 25 MG tablet Take 1 tablet (25 mg total) by mouth every 6 (six) hours as needed for nausea or vomiting. (Patient not taking: Reported on 01/01/2017) 30 tablet 2 Not Taking at Unknown time    I have reviewed patient's Past Medical Hx, Surgical Hx, Family Hx, Social Hx, medications and allergies.   ROS:  Review of Systems  Constitutional: Negative for fever.  Eyes: Positive for blurred vision. Negative for photophobia.  Gastrointestinal: Positive for abdominal pain. Negative for constipation, diarrhea and nausea.  Genitourinary: Negative for dysuria.  Musculoskeletal: Negative  for back pain and myalgias.  Neurological: Negative for dizziness.   Other systems negative  Physical Exam   Patient Vitals for the past 24 hrs:  BP Temp Pulse Resp  01/01/17 1649 (!) 139/91 - (!) 125 -  01/01/17 1639 (!) 138/91 98.4 F (36.9 C) (!) 135 18   Vitals:   01/01/17 1639 01/01/17 1649 01/01/17 1701 01/01/17 1716  BP: (!) 138/91 (!) 139/91 (!) 149/82 (!) 166/105  Pulse: (!) 135 (!) 125 (!) 119 (!) 122  Resp: 18     Temp: 98.4 F (36.9 C)      Vitals:   01/01/17 1746 01/01/17 1801 01/01/17 1816 01/01/17 1830  BP: (!) 152/84 (!)  152/90 (!) 154/89 (!) 154/89  Pulse: (!) 109 (!) 115 (!) 118   Resp:      Temp:        Constitutional: Well-developed, well-nourished female in no acute distress.  Cardiovascular: normal rate and rhythm Respiratory: normal effort, clear to auscultation bilaterally GI: Abd soft, non-tender, gravid appropriate for gestational age.   No rebound or guarding. MS: Extremities nontender, Trace edema, normal ROM Neurologic: Alert and oriented x 4. DTRs brisk, no clonus GU: Neg CVAT.  PELVIC EXAM: deferred  FHT:  Baseline 150 , moderate variability, accelerations present, no decelerations Contractions:  Irregular, infrequent, mild   Labs:    Results for orders placed or performed during the hospital encounter of 01/01/17 (from the past 24 hour(s))  Urinalysis, Routine w reflex microscopic     Status: None   Collection Time: 01/01/17  4:30 PM  Result Value Ref Range   Color, Urine YELLOW YELLOW   APPearance CLEAR CLEAR   Specific Gravity, Urine 1.013 1.005 - 1.030   pH 6.0 5.0 - 8.0   Glucose, UA NEGATIVE NEGATIVE mg/dL   Hgb urine dipstick NEGATIVE NEGATIVE   Bilirubin Urine NEGATIVE NEGATIVE   Ketones, ur NEGATIVE NEGATIVE mg/dL   Protein, ur NEGATIVE NEGATIVE mg/dL   Nitrite NEGATIVE NEGATIVE   Leukocytes, UA NEGATIVE NEGATIVE  Protein / creatinine ratio, urine     Status: None   Collection Time: 01/01/17  4:30 PM  Result Value Ref Range   Creatinine, Urine 81.00 mg/dL   Total Protein, Urine 8 mg/dL   Protein Creatinine Ratio 0.10 0.00 - 0.15 mg/mg[Cre]  CBC     Status: Abnormal   Collection Time: 01/01/17  5:05 PM  Result Value Ref Range   WBC 9.7 4.0 - 10.5 K/uL   RBC 4.25 3.87 - 5.11 MIL/uL   Hemoglobin 11.9 (L) 12.0 - 15.0 g/dL   HCT 81.1 91.4 - 78.2 %   MCV 88.5 78.0 - 100.0 fL   MCH 28.0 26.0 - 34.0 pg   MCHC 31.6 30.0 - 36.0 g/dL   RDW 95.6 21.3 - 08.6 %   Platelets 256 150 - 400 K/uL  Comprehensive metabolic panel     Status: Abnormal   Collection Time:  01/01/17  5:05 PM  Result Value Ref Range   Sodium 136 135 - 145 mmol/L   Potassium 3.9 3.5 - 5.1 mmol/L   Chloride 105 101 - 111 mmol/L   CO2 22 22 - 32 mmol/L   Glucose, Bld 99 65 - 99 mg/dL   BUN 8 6 - 20 mg/dL   Creatinine, Ser 5.78 0.44 - 1.00 mg/dL   Calcium 8.5 (L) 8.9 - 10.3 mg/dL   Total Protein 6.7 6.5 - 8.1 g/dL   Albumin 2.8 (L) 3.5 - 5.0 g/dL   AST  16 15 - 41 U/L   ALT 10 (L) 14 - 54 U/L   Alkaline Phosphatase 113 38 - 126 U/L   Total Bilirubin 0.2 (L) 0.3 - 1.2 mg/dL   GFR calc non Af Amer >60 >60 mL/min   GFR calc Af Amer >60 >60 mL/min   Anion gap 9 5 - 15     Imaging:  No results found.  MAU Course/MDM: I have ordered labs and reviewed results.  Preeclampsia labs WNL  NST reviewed and is reactive, irregular contractions Consult Dr Marcelle OverlieHolland with presentation, exam findings and test results. Reviewed prior and current blood pressure readings, as well as lab results Treatments in MAU included EFM, Tylenol for headache (somewhat relieved).    Assessment: Single intrauterine pregnancy at 5060w6d Gestational hypertension   Plan: Discharge home Strict preeclampsia precautions, return if needed Rest over the weekend Call office Monday morning for recheck Labor precautions and fetal kick counts Follow up in Office for prenatal visits and recheck of BP   Encouraged to return here or to other Urgent Care/ED if she develops worsening of symptoms, increase in pain, fever, or other concerning symptoms.   Pt stable at time of discharge.  Wynelle BourgeoisMarie Morganna Styles CNM, MSN Certified Nurse-Midwife 01/01/2017 4:55 PM

## 2017-01-03 ENCOUNTER — Observation Stay (HOSPITAL_COMMUNITY)
Admission: AD | Admit: 2017-01-03 | Discharge: 2017-01-05 | Disposition: A | Discharge status: Home / Self Care | Payer: BLUE CROSS/BLUE SHIELD | Source: Ambulatory Visit | Attending: Obstetrics and Gynecology | Admitting: Obstetrics and Gynecology

## 2017-01-03 ENCOUNTER — Encounter (HOSPITAL_COMMUNITY): Payer: Self-pay | Admitting: *Deleted

## 2017-01-03 ENCOUNTER — Other Ambulatory Visit: Payer: Self-pay

## 2017-01-03 DIAGNOSIS — Z79899 Other long term (current) drug therapy: Secondary | ICD-10-CM | POA: Insufficient documentation

## 2017-01-03 DIAGNOSIS — R51 Headache: Secondary | ICD-10-CM | POA: Diagnosis not present

## 2017-01-03 DIAGNOSIS — Z3689 Encounter for other specified antenatal screening: Secondary | ICD-10-CM | POA: Diagnosis not present

## 2017-01-03 DIAGNOSIS — O169 Unspecified maternal hypertension, unspecified trimester: Secondary | ICD-10-CM | POA: Diagnosis present

## 2017-01-03 DIAGNOSIS — O360131 Maternal care for anti-D [Rh] antibodies, third trimester, fetus 1: Secondary | ICD-10-CM | POA: Insufficient documentation

## 2017-01-03 DIAGNOSIS — O365931 Maternal care for other known or suspected poor fetal growth, third trimester, fetus 1: Secondary | ICD-10-CM | POA: Insufficient documentation

## 2017-01-03 DIAGNOSIS — O1493 Unspecified pre-eclampsia, third trimester: Secondary | ICD-10-CM

## 2017-01-03 DIAGNOSIS — O133 Gestational [pregnancy-induced] hypertension without significant proteinuria, third trimester: Secondary | ICD-10-CM | POA: Diagnosis not present

## 2017-01-03 DIAGNOSIS — H538 Other visual disturbances: Secondary | ICD-10-CM | POA: Diagnosis not present

## 2017-01-03 DIAGNOSIS — Z3A36 36 weeks gestation of pregnancy: Secondary | ICD-10-CM | POA: Insufficient documentation

## 2017-01-03 DIAGNOSIS — R1011 Right upper quadrant pain: Secondary | ICD-10-CM | POA: Diagnosis not present

## 2017-01-03 DIAGNOSIS — O26893 Other specified pregnancy related conditions, third trimester: Secondary | ICD-10-CM | POA: Diagnosis present

## 2017-01-03 LAB — COMPREHENSIVE METABOLIC PANEL
ALK PHOS: 109 U/L (ref 38–126)
ALT: 10 U/L — AB (ref 14–54)
ANION GAP: 7 (ref 5–15)
AST: 17 U/L (ref 15–41)
Albumin: 2.8 g/dL — ABNORMAL LOW (ref 3.5–5.0)
BUN: 8 mg/dL (ref 6–20)
CO2: 23 mmol/L (ref 22–32)
CREATININE: 0.65 mg/dL (ref 0.44–1.00)
Calcium: 8.5 mg/dL — ABNORMAL LOW (ref 8.9–10.3)
Chloride: 105 mmol/L (ref 101–111)
Glucose, Bld: 82 mg/dL (ref 65–99)
Potassium: 4.3 mmol/L (ref 3.5–5.1)
Sodium: 135 mmol/L (ref 135–145)
Total Bilirubin: 0.4 mg/dL (ref 0.3–1.2)
Total Protein: 6.6 g/dL (ref 6.5–8.1)

## 2017-01-03 LAB — URINALYSIS, ROUTINE W REFLEX MICROSCOPIC
BILIRUBIN URINE: NEGATIVE
Bacteria, UA: NONE SEEN
Glucose, UA: NEGATIVE mg/dL
Hgb urine dipstick: NEGATIVE
Ketones, ur: NEGATIVE mg/dL
Nitrite: NEGATIVE
PH: 7 (ref 5.0–8.0)
Protein, ur: NEGATIVE mg/dL
SPECIFIC GRAVITY, URINE: 1.014 (ref 1.005–1.030)

## 2017-01-03 LAB — ABO/RH: ABO/RH(D): A POS

## 2017-01-03 LAB — CBC
HCT: 36.2 % (ref 36.0–46.0)
HEMOGLOBIN: 11.8 g/dL — AB (ref 12.0–15.0)
MCH: 28.5 pg (ref 26.0–34.0)
MCHC: 32.6 g/dL (ref 30.0–36.0)
MCV: 87.4 fL (ref 78.0–100.0)
Platelets: 258 10*3/uL (ref 150–400)
RBC: 4.14 MIL/uL (ref 3.87–5.11)
RDW: 14.4 % (ref 11.5–15.5)
WBC: 11.5 10*3/uL — ABNORMAL HIGH (ref 4.0–10.5)

## 2017-01-03 LAB — PROTEIN / CREATININE RATIO, URINE
Creatinine, Urine: 117 mg/dL
PROTEIN CREATININE RATIO: 0.09 mg/mg{creat} (ref 0.00–0.15)
TOTAL PROTEIN, URINE: 11 mg/dL

## 2017-01-03 LAB — LACTATE DEHYDROGENASE: LDH: 142 U/L (ref 98–192)

## 2017-01-03 LAB — TYPE AND SCREEN
ABO/RH(D): A POS
ANTIBODY SCREEN: NEGATIVE

## 2017-01-03 LAB — URIC ACID: URIC ACID, SERUM: 4.5 mg/dL (ref 2.3–6.6)

## 2017-01-03 MED ORDER — ZOLPIDEM TARTRATE 5 MG PO TABS: 5.0000 mg | ORAL_TABLET | Freq: Every evening | ORAL | Status: DC | PRN

## 2017-01-03 MED ORDER — ACETAMINOPHEN 325 MG PO TABS
650.0000 mg | ORAL_TABLET | ORAL | Status: DC | PRN
Start: 2017-01-03 — End: 2017-01-05
  Administered 2017-01-04: 650 mg via ORAL
  Filled 2017-01-03: qty 2

## 2017-01-03 MED ORDER — SODIUM CHLORIDE 0.9% FLUSH
3.0000 mL | Freq: Two times a day (BID) | INTRAVENOUS | Status: DC
  Administered 2017-01-03 – 2017-01-04 (×2): 3 mL via INTRAVENOUS

## 2017-01-03 MED ORDER — CALCIUM CARBONATE ANTACID 500 MG PO CHEW
2.0000 | CHEWABLE_TABLET | ORAL | Status: DC | PRN
  Administered 2017-01-04: 400 mg via ORAL
  Filled 2017-01-03: qty 2

## 2017-01-03 MED ORDER — SODIUM CHLORIDE 0.9% FLUSH: 3.0000 mL | INTRAVENOUS | Status: DC | PRN

## 2017-01-03 MED ORDER — BETAMETHASONE SOD PHOS & ACET 6 (3-3) MG/ML IJ SUSP
12.0000 mg | INTRAMUSCULAR | Status: AC
  Administered 2017-01-03 – 2017-01-04 (×2): 12 mg via INTRAMUSCULAR
  Filled 2017-01-03 (×2): qty 2

## 2017-01-03 MED ORDER — DOCUSATE SODIUM 100 MG PO CAPS: 100.0000 mg | ORAL_CAPSULE | Freq: Every day | ORAL | Status: DC

## 2017-01-03 MED ORDER — PRENATAL MULTIVITAMIN CH: 1.0000 | ORAL_TABLET | Freq: Every day | ORAL | Status: DC

## 2017-01-03 MED ORDER — BETAMETHASONE SOD PHOS & ACET 6 (3-3) MG/ML IJ SUSP: 12.0000 mg | Freq: Once | INTRAMUSCULAR | Status: DC

## 2017-01-03 MED ORDER — SODIUM CHLORIDE 0.9 % IV SOLN
250.0000 mL | INTRAVENOUS | Status: DC | PRN
Start: 2017-01-03 — End: 2017-01-05

## 2017-01-03 MED ORDER — ACETAMINOPHEN 500 MG PO TABS
1000.0000 mg | ORAL_TABLET | Freq: Once | ORAL | Status: AC
  Administered 2017-01-03: 1000 mg via ORAL
  Filled 2017-01-03: qty 2

## 2017-01-03 NOTE — MAU Provider Note (Signed)
History     CSN: [id number]  Arrival date and time: 01/03/17 1519   None     Chief Complaint  Patient presents with  . Headache  . Hypertension   HPI   Ms.Christina Freeman is a 21 y.o. female G2P0010 @ 4678w1d here in MAU with HA and elevated BP readings. States she was seen over the weekend with the same complaints and was told thst she is on the verge of having preeclampia. She was seen in the office today and sent over here for further evaluation.  States she has had a HA all day. The HA is located in the middle of her forehead and around to the back of her head. She took tylenol today around noon (650 mg) This did not help her head. No history of HA's.  She currently rates her HA 7/10. + fetal movement.  No bleeding.   OB History    Gravida Para Term Preterm AB Living   2       1     SAB TAB Ectopic Multiple Live Births   1              History reviewed. No pertinent past medical history.  Past Surgical History:  Procedure Laterality Date  . NO PAST SURGERIES    . tubes in ears      Family History  Problem Relation Age of Onset  . Diabetes Father     Social History   Tobacco Use  . Smoking status: Never Smoker  . Smokeless tobacco: Never Used  Substance Use Topics  . Alcohol use: No  . Drug use: No    Allergies: No Known Allergies  Medications Prior to Admission  Medication Sig Dispense Refill Last Dose  . acetaminophen (TYLENOL) 325 MG tablet Take 650 mg every 6 (six) hours as needed by mouth for headache.   01/03/2017 at Unknown time  . calcium carbonate (TUMS - DOSED IN MG ELEMENTAL CALCIUM) 500 MG chewable tablet Chew 2 tablets 2 (two) times daily as needed by mouth for indigestion or heartburn.   01/02/2017 at Unknown time  . Prenatal Vit-Fe Fumarate-FA (PRENATAL MULTIVITAMIN) TABS tablet Take 1 tablet daily at 12 noon by mouth.   01/03/2017 at Unknown time   Results for orders placed or performed during the hospital encounter of 01/03/17 (from the past 24  hour(s))  Protein / creatinine ratio, urine     Status: None   Collection Time: 01/03/17  3:30 PM  Result Value Ref Range   Creatinine, Urine 117.00 mg/dL   Total Protein, Urine 11 mg/dL   Protein Creatinine Ratio 0.09 0.00 - 0.15 mg/mg[Cre]  Urinalysis, Routine w reflex microscopic     Status: Abnormal   Collection Time: 01/03/17  3:30 PM  Result Value Ref Range   Color, Urine YELLOW YELLOW   APPearance HAZY (A) CLEAR   Specific Gravity, Urine 1.014 1.005 - 1.030   pH 7.0 5.0 - 8.0   Glucose, UA NEGATIVE NEGATIVE mg/dL   Hgb urine dipstick NEGATIVE NEGATIVE   Bilirubin Urine NEGATIVE NEGATIVE   Ketones, ur NEGATIVE NEGATIVE mg/dL   Protein, ur NEGATIVE NEGATIVE mg/dL   Nitrite NEGATIVE NEGATIVE   Leukocytes, UA MODERATE (A) NEGATIVE   RBC / HPF 0-5 0 - 5 RBC/hpf   WBC, UA 0-5 0 - 5 WBC/hpf   Bacteria, UA NONE SEEN NONE SEEN   Squamous Epithelial / LPF 6-30 (A) NONE SEEN   Mucus PRESENT   CBC  Status: Abnormal   Collection Time: 01/03/17  3:41 PM  Result Value Ref Range   WBC 11.5 (H) 4.0 - 10.5 K/uL   RBC 4.14 3.87 - 5.11 MIL/uL   Hemoglobin 11.8 (L) 12.0 - 15.0 g/dL   HCT 16.1 09.6 - 04.5 %   MCV 87.4 78.0 - 100.0 fL   MCH 28.5 26.0 - 34.0 pg   MCHC 32.6 30.0 - 36.0 g/dL   RDW 40.9 81.1 - 91.4 %   Platelets 258 150 - 400 K/uL  Comprehensive metabolic panel     Status: Abnormal   Collection Time: 01/03/17  3:41 PM  Result Value Ref Range   Sodium 135 135 - 145 mmol/L   Potassium 4.3 3.5 - 5.1 mmol/L   Chloride 105 101 - 111 mmol/L   CO2 23 22 - 32 mmol/L   Glucose, Bld 82 65 - 99 mg/dL   BUN 8 6 - 20 mg/dL   Creatinine, Ser 7.82 0.44 - 1.00 mg/dL   Calcium 8.5 (L) 8.9 - 10.3 mg/dL   Total Protein 6.6 6.5 - 8.1 g/dL   Albumin 2.8 (L) 3.5 - 5.0 g/dL   AST 17 15 - 41 U/L   ALT 10 (L) 14 - 54 U/L   Alkaline Phosphatase 109 38 - 126 U/L   Total Bilirubin 0.4 0.3 - 1.2 mg/dL   GFR calc non Af Amer >60 >60 mL/min   GFR calc Af Amer >60 >60 mL/min   Anion gap 7 5  - 15  Uric acid     Status: None   Collection Time: 01/03/17  3:41 PM  Result Value Ref Range   Uric Acid, Serum 4.5 2.3 - 6.6 mg/dL  Lactate dehydrogenase     Status: None   Collection Time: 01/03/17  3:41 PM  Result Value Ref Range   LDH 142 98 - 192 U/L    Review of Systems  Constitutional: Negative for fever.  Eyes: Positive for photophobia and visual disturbance (Blurry vision, with dots ).  Gastrointestinal: Positive for abdominal pain (Sharp pain in her RUQ at times, comes and goes ).  Neurological: Positive for headaches.   Physical Exam   Blood pressure (!) 147/102, pulse (!) 124, temperature 98 F (36.7 C), temperature source Oral, resp. rate 20, last menstrual period 01/09/2016, SpO2 99 %, unknown if currently breastfeeding.   Patient Vitals for the past 24 hrs:  BP Temp Temp src Pulse Resp SpO2  01/03/17 1730 137/83 - - (!) 112 - -  01/03/17 1700 (!) 148/86 - - (!) 123 - 99 %  01/03/17 1645 137/80 - - (!) 121 - -  01/03/17 1641 - - - - - 99 %  01/03/17 1630 131/81 - - (!) 117 - 99 %  01/03/17 1616 129/80 - - (!) 108 - -  01/03/17 1600 (!) 147/102 - - (!) 124 - -  01/03/17 1559 - - - - - 99 %  01/03/17 1549 - - - - - 100 %  01/03/17 1545 131/80 - - (!) 118 - -  01/03/17 1540 138/83 98 F (36.7 C) Oral (!) 122 20 99 %  01/03/17 1538 - - - - - 99 %    Physical Exam  Constitutional: She is oriented to person, place, and time. She appears well-developed and well-nourished. No distress.  HENT:  Head: Normocephalic.  Eyes: Pupils are equal, round, and reactive to light.  Respiratory: Effort normal.  GI: Soft. Normal appearance. There is tenderness in the right  upper quadrant and epigastric area. There is no rigidity, no rebound and no guarding.  Genitourinary:  Genitourinary Comments: Cervix 1 cm in the office today   Musculoskeletal: Normal range of motion.  Neurological: She is alert and oriented to person, place, and time. She has normal reflexes. She displays  normal reflexes.  Negative clonus   Skin: Skin is warm. She is not diaphoretic.  Psychiatric: Her behavior is normal.   Fetal Tracing: Baseline: 130 bpm Variability: Moderate  Accelerations: 15x15 Decelerations: None  Toco: None  MAU Course  Procedures None  MDM  PIH labs  Tylenol 1 gram PO  No improvement following tylenol Discussed labs, BP's and HPI with Dr. Renaldo FiddlerAdkins, will admit to [facility].   Assessment and Plan   A:  1. Preeclampsia, third trimester     P:  Admit to 3rd floor Betamethasone given  24 hour urine  Rasch, Harolyn RutherfordJennifer I, NP 01/03/2017 5:46 PM

## 2017-01-03 NOTE — H&P (Signed)
Christina Freeman is a 21 y.o. female G2P0 @ 36+1 wks presenting with HA.  Pt was seen in the office and noted to have elevated BP - sent to MAU for evaluation.  BP in MAU 130-140s/80s.  HA persist despite tylenol.  Reports RUQ pain but this has been ongoing during pregnancy.    OB History    Gravida Para Term Preterm AB Living   2       1     SAB TAB Ectopic Multiple Live Births   1             History reviewed. No pertinent past medical history. Past Surgical History:  Procedure Laterality Date  . NO PAST SURGERIES    . tubes in ears     Family History: family history includes Diabetes in her father. Social History:  reports that  has never smoked. she has never used smokeless tobacco. She reports that she does not drink alcohol or use drugs.     Maternal Diabetes: No Genetic Screening: Declined Maternal Ultrasounds/Referrals: Normal Fetal Ultrasounds or other Referrals:  None Maternal Substance Abuse:  No Significant Maternal Medications:  None Significant Maternal Lab Results:  None Other Comments:  None  FHT reactive Toco quiet Blood pressure 137/83, pulse (!) 112, temperature 98 F (36.7 C), temperature source Oral, resp. rate 20, last menstrual period 01/09/2016, SpO2 99 %, unknown if currently breastfeeding. Exam Physical Exam  Gen - NAD Abd - gravid, mildly tender RUQ Ext - NT, no edema Cvx 1cm per exam in office today  Prenatal labs: ABO, Rh:   Antibody:   Rubella:   RPR:    HBsAg:    HIV:    GBS:     Results for orders placed or performed during the hospital encounter of 01/03/17 (from the past 24 hour(s))  Protein / creatinine ratio, urine     Status: None   Collection Time: 01/03/17  3:30 PM  Result Value Ref Range   Creatinine, Urine 117.00 mg/dL   Total Protein, Urine 11 mg/dL   Protein Creatinine Ratio 0.09 0.00 - 0.15 mg/mg[Cre]  Urinalysis, Routine w reflex microscopic     Status: Abnormal   Collection Time: 01/03/17  3:30 PM  Result Value Ref Range    Color, Urine YELLOW YELLOW   APPearance HAZY (A) CLEAR   Specific Gravity, Urine 1.014 1.005 - 1.030   pH 7.0 5.0 - 8.0   Glucose, UA NEGATIVE NEGATIVE mg/dL   Hgb urine dipstick NEGATIVE NEGATIVE   Bilirubin Urine NEGATIVE NEGATIVE   Ketones, ur NEGATIVE NEGATIVE mg/dL   Protein, ur NEGATIVE NEGATIVE mg/dL   Nitrite NEGATIVE NEGATIVE   Leukocytes, UA MODERATE (A) NEGATIVE   RBC / HPF 0-5 0 - 5 RBC/hpf   WBC, UA 0-5 0 - 5 WBC/hpf   Bacteria, UA NONE SEEN NONE SEEN   Squamous Epithelial / LPF 6-30 (A) NONE SEEN   Mucus PRESENT   CBC     Status: Abnormal   Collection Time: 01/03/17  3:41 PM  Result Value Ref Range   WBC 11.5 (H) 4.0 - 10.5 K/uL   RBC 4.14 3.87 - 5.11 MIL/uL   Hemoglobin 11.8 (L) 12.0 - 15.0 g/dL   HCT 40.936.2 81.136.0 - 91.446.0 %   MCV 87.4 78.0 - 100.0 fL   MCH 28.5 26.0 - 34.0 pg   MCHC 32.6 30.0 - 36.0 g/dL   RDW 78.214.4 95.611.5 - 21.315.5 %   Platelets 258 150 - 400 K/uL  Comprehensive  metabolic panel     Status: Abnormal   Collection Time: 01/03/17  3:41 PM  Result Value Ref Range   Sodium 135 135 - 145 mmol/L   Potassium 4.3 3.5 - 5.1 mmol/L   Chloride 105 101 - 111 mmol/L   CO2 23 22 - 32 mmol/L   Glucose, Bld 82 65 - 99 mg/dL   BUN 8 6 - 20 mg/dL   Creatinine, Ser 1.610.65 0.44 - 1.00 mg/dL   Calcium 8.5 (L) 8.9 - 10.3 mg/dL   Total Protein 6.6 6.5 - 8.1 g/dL   Albumin 2.8 (L) 3.5 - 5.0 g/dL   AST 17 15 - 41 U/L   ALT 10 (L) 14 - 54 U/L   Alkaline Phosphatase 109 38 - 126 U/L   Total Bilirubin 0.4 0.3 - 1.2 mg/dL   GFR calc non Af Amer >60 >60 mL/min   GFR calc Af Amer >60 >60 mL/min   Anion gap 7 5 - 15  Uric acid     Status: None   Collection Time: 01/03/17  3:41 PM  Result Value Ref Range   Uric Acid, Serum 4.5 2.3 - 6.6 mg/dL  Lactate dehydrogenase     Status: None   Collection Time: 01/03/17  3:41 PM  Result Value Ref Range   LDH 142 98 - 192 U/L    Assessment/Plan: Gestational HTN with HA 2 36 wks.  P:C ratio normal rec admission for BMZ and 24 hr  urine collection Rpt labs in morning US for EFW/AFI   Christina Freeman 01/03/2017, 5:54 PM

## 2017-01-03 NOTE — Plan of Care (Signed)
Patient and significant other instructed in plan of care and routines of unit. Questions encouraged and answered. Patient cooperating with collection of 24 hour urine.

## 2017-01-03 NOTE — MAU Note (Signed)
Pt sent to MAU from MD office for BP eval.  Pt reports she has a H/A, visual disturbances, and epigastric pain.  Pt noted to have generalized swelling, 2+ DTR's, and neg clonus.  Denies VB or LOF.  Reports +FM.

## 2017-01-04 ENCOUNTER — Other Ambulatory Visit: Payer: Self-pay

## 2017-01-04 ENCOUNTER — Observation Stay (HOSPITAL_COMMUNITY): Payer: BLUE CROSS/BLUE SHIELD

## 2017-01-04 LAB — CBC
HCT: 33.9 % — ABNORMAL LOW (ref 36.0–46.0)
Hemoglobin: 11 g/dL — ABNORMAL LOW (ref 12.0–15.0)
MCH: 28.2 pg (ref 26.0–34.0)
MCHC: 32.4 g/dL (ref 30.0–36.0)
MCV: 86.9 fL (ref 78.0–100.0)
PLATELETS: 256 10*3/uL (ref 150–400)
RBC: 3.9 MIL/uL (ref 3.87–5.11)
RDW: 14.1 % (ref 11.5–15.5)
WBC: 11.6 10*3/uL — AB (ref 4.0–10.5)

## 2017-01-04 LAB — COMPREHENSIVE METABOLIC PANEL
ALT: 11 U/L — AB (ref 14–54)
AST: 18 U/L (ref 15–41)
Albumin: 2.5 g/dL — ABNORMAL LOW (ref 3.5–5.0)
Alkaline Phosphatase: 102 U/L (ref 38–126)
Anion gap: 7 (ref 5–15)
BILIRUBIN TOTAL: 0.3 mg/dL (ref 0.3–1.2)
BUN: 8 mg/dL (ref 6–20)
CHLORIDE: 105 mmol/L (ref 101–111)
CO2: 22 mmol/L (ref 22–32)
CREATININE: 0.56 mg/dL (ref 0.44–1.00)
Calcium: 8.5 mg/dL — ABNORMAL LOW (ref 8.9–10.3)
GFR calc Af Amer: >60 mL/min (ref >60)
Glucose, Bld: 103 mg/dL — ABNORMAL HIGH (ref 65–99)
Potassium: 4.5 mmol/L (ref 3.5–5.1)
Sodium: 134 mmol/L — ABNORMAL LOW (ref 135–145)
TOTAL PROTEIN: 5.7 g/dL — AB (ref 6.5–8.1)

## 2017-01-04 LAB — PROTEIN, URINE, 24 HOUR
Collection Interval-UPROT: 24 hours
PROTEIN, URINE: 9 mg/dL
Protein, 24H Urine: 173 mg/d — ABNORMAL HIGH (ref 50–100)
Urine Total Volume-UPROT: 1925 mL

## 2017-01-04 LAB — OB RESULTS CONSOLE GBS: STREP GROUP B AG: POSITIVE

## 2017-01-04 MED ORDER — OXYCODONE-ACETAMINOPHEN 5-325 MG PO TABS
1.0000 | ORAL_TABLET | Freq: Once | ORAL | Status: AC
  Administered 2017-01-04: 1 via ORAL
  Filled 2017-01-04: qty 1

## 2017-01-04 MED ORDER — BUTALBITAL-APAP-CAFFEINE 50-325-40 MG PO TABS
2.0000 | ORAL_TABLET | ORAL | Status: DC | PRN
  Administered 2017-01-04 – 2017-01-05 (×2): 2 via ORAL
  Filled 2017-01-04 (×2): qty 2

## 2017-01-04 NOTE — Progress Notes (Signed)
Patient had headache this am - given po Percocet. Now reports headache still present but not severe  BP 134/73 (BP Location: Left Arm)   Pulse 93   Temp 98.1 F (36.7 C) (Oral)   Resp 18   Ht 5\' 5"  (1.651 m)   Wt 108 kg (238 lb)   LMP 01/09/2016   SpO2 100%   BMI 39.61 kg/m  Abdomen soft and non tender  FHR is Category 1  Results for orders placed or performed during the hospital encounter of 01/03/17 (from the past 24 hour(s))  Protein / creatinine ratio, urine     Status: None   Collection Time: 01/03/17  3:30 PM  Result Value Ref Range   Creatinine, Urine 117.00 mg/dL   Total Protein, Urine 11 mg/dL   Protein Creatinine Ratio 0.09 0.00 - 0.15 mg/mg[Cre]  Urinalysis, Routine w reflex microscopic     Status: Abnormal   Collection Time: 01/03/17  3:30 PM  Result Value Ref Range   Color, Urine YELLOW YELLOW   APPearance HAZY (A) CLEAR   Specific Gravity, Urine 1.014 1.005 - 1.030   pH 7.0 5.0 - 8.0   Glucose, UA NEGATIVE NEGATIVE mg/dL   Hgb urine dipstick NEGATIVE NEGATIVE   Bilirubin Urine NEGATIVE NEGATIVE   Ketones, ur NEGATIVE NEGATIVE mg/dL   Protein, ur NEGATIVE NEGATIVE mg/dL   Nitrite NEGATIVE NEGATIVE   Leukocytes, UA MODERATE (A) NEGATIVE   RBC / HPF 0-5 0 - 5 RBC/hpf   WBC, UA 0-5 0 - 5 WBC/hpf   Bacteria, UA NONE SEEN NONE SEEN   Squamous Epithelial / LPF 6-30 (A) NONE SEEN   Mucus PRESENT   CBC     Status: Abnormal   Collection Time: 01/03/17  3:41 PM  Result Value Ref Range   WBC 11.5 (H) 4.0 - 10.5 K/uL   RBC 4.14 3.87 - 5.11 MIL/uL   Hemoglobin 11.8 (L) 12.0 - 15.0 g/dL   HCT 16.136.2 09.636.0 - 04.546.0 %   MCV 87.4 78.0 - 100.0 fL   MCH 28.5 26.0 - 34.0 pg   MCHC 32.6 30.0 - 36.0 g/dL   RDW 40.914.4 81.111.5 - 91.415.5 %   Platelets 258 150 - 400 K/uL  Comprehensive metabolic panel     Status: Abnormal   Collection Time: 01/03/17  3:41 PM  Result Value Ref Range   Sodium 135 135 - 145 mmol/L   Potassium 4.3 3.5 - 5.1 mmol/L   Chloride 105 101 - 111 mmol/L   CO2  23 22 - 32 mmol/L   Glucose, Bld 82 65 - 99 mg/dL   BUN 8 6 - 20 mg/dL   Creatinine, Ser 7.820.65 0.44 - 1.00 mg/dL   Calcium 8.5 (L) 8.9 - 10.3 mg/dL   Total Protein 6.6 6.5 - 8.1 g/dL   Albumin 2.8 (L) 3.5 - 5.0 g/dL   AST 17 15 - 41 U/L   ALT 10 (L) 14 - 54 U/L   Alkaline Phosphatase 109 38 - 126 U/L   Total Bilirubin 0.4 0.3 - 1.2 mg/dL   GFR calc non Af Amer >60 >60 mL/min   GFR calc Af Amer >60 >60 mL/min   Anion gap 7 5 - 15  Uric acid     Status: None   Collection Time: 01/03/17  3:41 PM  Result Value Ref Range   Uric Acid, Serum 4.5 2.3 - 6.6 mg/dL  Lactate dehydrogenase     Status: None   Collection Time: 01/03/17  3:41 PM  Result Value Ref Range   LDH 142 98 - 192 U/L  [facility]     Status: None   Collection Time: 01/03/17  6:19 PM  Result Value Ref Range   ABO/RH(D) A POS    Antibody Screen NEG    Sample Expiration 01/06/2017   ABO/Rh     Status: None   Collection Time: 01/03/17  6:19 PM  Result Value Ref Range   ABO/RH(D) A POS   Comprehensive metabolic panel     Status: Abnormal   Collection Time: 01/04/17  6:51 AM  Result Value Ref Range   Sodium 134 (L) 135 - 145 mmol/L   Potassium 4.5 3.5 - 5.1 mmol/L   Chloride 105 101 - 111 mmol/L   CO2 22 22 - 32 mmol/L   Glucose, Bld 103 (H) 65 - 99 mg/dL   BUN 8 6 - 20 mg/dL   Creatinine, Ser 9.520.56 0.44 - 1.00 mg/dL   Calcium 8.5 (L) 8.9 - 10.3 mg/dL   Total Protein 5.7 (L) 6.5 - 8.1 g/dL   Albumin 2.5 (L) 3.5 - 5.0 g/dL   AST 18 15 - 41 U/L   ALT 11 (L) 14 - 54 U/L   Alkaline Phosphatase 102 38 - 126 U/L   Total Bilirubin 0.3 0.3 - 1.2 mg/dL   GFR calc non Af Amer >60 >60 mL/min   GFR calc Af Amer >60 >60 mL/min   Anion gap 7 5 - 15  CBC     Status: Abnormal   Collection Time: 01/04/17  6:51 AM  Result Value Ref Range   WBC 11.6 (H) 4.0 - 10.5 K/uL   RBC 3.90 3.87 - 5.11 MIL/uL   Hemoglobin 11.0 (L) 12.0 - 15.0 g/dL   HCT 84.133.9 (L) 32.436.0 - 40.146.0 %   MCV 86.9 78.0 - 100.0 fL    MCH 28.2 26.0 - 34.0 pg   MCHC 32.4 30.0 - 36.0 g/dL   RDW 02.714.1 25.311.5 - 66.415.5 %   Platelets 256 150 - 400 K/uL   24 hour urine collection will be complete at 750 pm  IMPRESSION: IUP at 36 w 2 days Gestational hypertension  PLAN: I do not think that BPs and symptoms meet criteria for induction at this point. Continue 24 hour collection  Finish steroid series Fioricet for headache now Continue to monitor BPs closely  I recommend 37 week induction if remains stable - discussed with patient and will await 24 hour urine collection

## 2017-01-05 ENCOUNTER — Telehealth (HOSPITAL_COMMUNITY): Payer: Self-pay | Admitting: *Deleted

## 2017-01-05 ENCOUNTER — Encounter (HOSPITAL_COMMUNITY): Payer: Self-pay | Admitting: *Deleted

## 2017-01-05 MED ORDER — BUTALBITAL-APAP-CAFFEINE 50-325-40 MG PO TABS: 2.0000 | ORAL_TABLET | ORAL | 0 refills | Status: DC | PRN

## 2017-01-05 NOTE — Progress Notes (Signed)
Patient ID: Autumn MessingMorgan Piche, female   DOB: 1995-07-26, 21 y.o.   MRN: [id number] Pt without complaints GFM No PIH sxs VSSAF BP 155/62 FHR Cat 1 140s ctxs rare  DTRs 1/4 Abd Gravid nt  IUP at 36 3/7 Gest HTN - no severe features and normal labs DC for outpt management with IOL at 37-38 weeks DL

## 2017-01-05 NOTE — Telephone Encounter (Signed)
Preadmission screen  

## 2017-01-05 NOTE — Discharge Summary (Addendum)
Obstetric Discharge Summary Reason for Admission: observation/evaluation Prenatal Procedures: ultrasound Intrapartum Procedures: Monitoring, labs, BMZ series Postpartum Procedures: not delivered Complications-Operative and Postpartum: none Hemoglobin  Date Value Ref Range Status  01/04/2017 11.0 (L) 12.0 - 15.0 g/dL Final   HCT  Date Value Ref Range Status  01/04/2017 33.9 (L) 36.0 - 46.0 % Final    Physical Exam:  General: alert, cooperative, appears stated age and Gravid Lochia: appropriate Uterine Fundus: gravid Incision:  DVT Evaluation: No evidence of DVT seen on physical exam.  Discharge Diagnoses: Gest HTN without severe features  Discharge Information: Date: 01/05/2017 Activity: light activity Diet: routine Medications: firoicet Condition: stable Instructions: refer to practice specific booklet and mod berdrest with PIH warnings fu in 2 days in office Discharge to: home   Newborn Data: This patient has no babies on file. Home with still pregnant.  Christina Freeman 01/05/2017, 10:07 AM

## 2017-01-06 ENCOUNTER — Telehealth (HOSPITAL_COMMUNITY): Payer: Self-pay | Admitting: *Deleted

## 2017-01-06 NOTE — Telephone Encounter (Signed)
Preadmission screen  

## 2017-01-07 ENCOUNTER — Encounter (HOSPITAL_COMMUNITY): Payer: Self-pay | Admitting: *Deleted

## 2017-01-07 ENCOUNTER — Telehealth (HOSPITAL_COMMUNITY): Payer: Self-pay | Admitting: *Deleted

## 2017-01-07 NOTE — Telephone Encounter (Signed)
Preadmission screen No desire for self harm at present as of 01/07/2017

## 2017-01-10 ENCOUNTER — Encounter (HOSPITAL_COMMUNITY): Payer: Self-pay | Admitting: Certified Registered Nurse Anesthetist

## 2017-01-10 ENCOUNTER — Inpatient Hospital Stay (HOSPITAL_COMMUNITY): Payer: BLUE CROSS/BLUE SHIELD | Admitting: Anesthesiology

## 2017-01-10 ENCOUNTER — Encounter (HOSPITAL_COMMUNITY): Payer: Self-pay

## 2017-01-10 ENCOUNTER — Inpatient Hospital Stay (HOSPITAL_COMMUNITY)
Admission: AD | Admit: 2017-01-10 | Discharge: 2017-01-13 | DRG: 788 | Disposition: A | Discharge status: Home / Self Care | Payer: BLUE CROSS/BLUE SHIELD | Source: Ambulatory Visit | Attending: Obstetrics and Gynecology | Admitting: Obstetrics and Gynecology

## 2017-01-10 ENCOUNTER — Encounter (HOSPITAL_COMMUNITY)
Admission: AD | Disposition: A | Discharge status: Home / Self Care | Payer: Self-pay | Source: Ambulatory Visit | Attending: Obstetrics and Gynecology

## 2017-01-10 DIAGNOSIS — O1414 Severe pre-eclampsia complicating childbirth: Secondary | ICD-10-CM | POA: Diagnosis present

## 2017-01-10 DIAGNOSIS — O99214 Obesity complicating childbirth: Secondary | ICD-10-CM | POA: Diagnosis present

## 2017-01-10 DIAGNOSIS — O99824 Streptococcus B carrier state complicating childbirth: Secondary | ICD-10-CM | POA: Diagnosis present

## 2017-01-10 DIAGNOSIS — Z3A37 37 weeks gestation of pregnancy: Secondary | ICD-10-CM

## 2017-01-10 LAB — CBC
HCT: 38 % (ref 36.0–46.0)
HCT: 33.8 % — ABNORMAL LOW (ref 36.0–46.0)
HEMOGLOBIN: 11.9 g/dL — AB (ref 12.0–15.0)
HEMOGLOBIN: 10.5 g/dL — AB (ref 12.0–15.0)
MCH: 27.9 pg (ref 26.0–34.0)
MCH: 27.8 pg (ref 26.0–34.0)
MCHC: 31.3 g/dL (ref 30.0–36.0)
MCHC: 31.1 g/dL (ref 30.0–36.0)
MCV: 89.2 fL (ref 78.0–100.0)
MCV: 89.4 fL (ref 78.0–100.0)
PLATELETS: 320 10*3/uL (ref 150–400)
Platelets: 292 10*3/uL (ref 150–400)
RBC: 4.26 MIL/uL (ref 3.87–5.11)
RBC: 3.78 MIL/uL — AB (ref 3.87–5.11)
RDW: 14.2 % (ref 11.5–15.5)
RDW: 14.5 % (ref 11.5–15.5)
WBC: 11.7 10*3/uL — ABNORMAL HIGH (ref 4.0–10.5)
WBC: 23 10*3/uL — ABNORMAL HIGH (ref 4.0–10.5)

## 2017-01-10 LAB — COMPREHENSIVE METABOLIC PANEL
ALBUMIN: 2.6 g/dL — AB (ref 3.5–5.0)
ALT: 9 U/L — ABNORMAL LOW (ref 14–54)
ANION GAP: 8 (ref 5–15)
AST: 16 U/L (ref 15–41)
Alkaline Phosphatase: 119 U/L (ref 38–126)
BILIRUBIN TOTAL: 0.2 mg/dL — AB (ref 0.3–1.2)
BUN: 11 mg/dL (ref 6–20)
CO2: 21 mmol/L — ABNORMAL LOW (ref 22–32)
Calcium: 8.8 mg/dL — ABNORMAL LOW (ref 8.9–10.3)
Chloride: 106 mmol/L (ref 101–111)
Creatinine, Ser: 0.5 mg/dL (ref 0.44–1.00)
GFR calc Af Amer: >60 mL/min (ref >60)
GFR calc non Af Amer: >60 mL/min (ref >60)
GLUCOSE: 94 mg/dL (ref 65–99)
POTASSIUM: 4.5 mmol/L (ref 3.5–5.1)
SODIUM: 135 mmol/L (ref 135–145)
TOTAL PROTEIN: 6.2 g/dL — AB (ref 6.5–8.1)

## 2017-01-10 LAB — TYPE AND SCREEN
ABO/RH(D): A POS
ANTIBODY SCREEN: NEGATIVE

## 2017-01-10 LAB — PROTEIN / CREATININE RATIO, URINE
Creatinine, Urine: 72 mg/dL
PROTEIN CREATININE RATIO: 0.1 mg/mg{creat} (ref 0.00–0.15)
Total Protein, Urine: 7 mg/dL

## 2017-01-10 LAB — POCT FERN TEST: POCT FERN TEST: POSITIVE

## 2017-01-10 SURGERY — Surgical Case
Anesthesia: Epidural

## 2017-01-10 MED ORDER — HYDROMORPHONE HCL 1 MG/ML IJ SOLN
INTRAMUSCULAR | Status: AC
  Filled 2017-01-10: qty 0.5

## 2017-01-10 MED ORDER — PENICILLIN G POT IN DEXTROSE 60000 UNIT/ML IV SOLN
3.0000 10*6.[IU] | INTRAVENOUS | Status: DC
  Administered 2017-01-10 (×2): 3 10*6.[IU] via INTRAVENOUS
  Filled 2017-01-10 (×4): qty 50

## 2017-01-10 MED ORDER — OXYTOCIN 40 UNITS IN LACTATED RINGERS INFUSION - SIMPLE MED
1.0000 m[IU]/min | INTRAVENOUS | Status: DC
Start: 2017-01-10 — End: 2017-01-11
  Administered 2017-01-10: 2 m[IU]/min via INTRAVENOUS
  Filled 2017-01-10: qty 1000

## 2017-01-10 MED ORDER — EPHEDRINE 5 MG/ML INJ: 10.0000 mg | INTRAVENOUS | Status: DC | PRN

## 2017-01-10 MED ORDER — FLEET ENEMA 7-19 GM/118ML RE ENEM: 1.0000 | ENEMA | RECTAL | Status: DC | PRN

## 2017-01-10 MED ORDER — OXYCODONE-ACETAMINOPHEN 5-325 MG PO TABS: 1.0000 | ORAL_TABLET | ORAL | Status: DC | PRN

## 2017-01-10 MED ORDER — OXYTOCIN BOLUS FROM INFUSION: 500.0000 mL | Freq: Once | INTRAVENOUS | Status: DC

## 2017-01-10 MED ORDER — SCOPOLAMINE 1 MG/3DAYS TD PT72
MEDICATED_PATCH | TRANSDERMAL | Status: DC | PRN
  Administered 2017-01-10: 1 via TRANSDERMAL

## 2017-01-10 MED ORDER — ONDANSETRON HCL 4 MG/2ML IJ SOLN: 4.0000 mg | Freq: Four times a day (QID) | INTRAMUSCULAR | Status: DC | PRN

## 2017-01-10 MED ORDER — LACTATED RINGERS IV SOLN: 500.0000 mL | INTRAVENOUS | Status: DC | PRN

## 2017-01-10 MED ORDER — SODIUM CHLORIDE 0.9 % IR SOLN
Status: DC | PRN
  Administered 2017-01-10: 1000 mL

## 2017-01-10 MED ORDER — SOD CITRATE-CITRIC ACID 500-334 MG/5ML PO SOLN: 30.0000 mL | ORAL | Status: DC

## 2017-01-10 MED ORDER — LIDOCAINE HCL (PF) 1 % IJ SOLN
INTRAMUSCULAR | Status: DC | PRN
  Administered 2017-01-10 (×2): 5 mL via EPIDURAL

## 2017-01-10 MED ORDER — OXYCODONE HCL 5 MG PO TABS: 5.0000 mg | ORAL_TABLET | Freq: Once | ORAL | Status: DC | PRN

## 2017-01-10 MED ORDER — LACTATED RINGERS IV SOLN
500.0000 mL | Freq: Once | INTRAVENOUS | Status: AC
  Administered 2017-01-10: 500 mL via INTRAVENOUS

## 2017-01-10 MED ORDER — SCOPOLAMINE 1 MG/3DAYS TD PT72
MEDICATED_PATCH | TRANSDERMAL | Status: AC
  Filled 2017-01-10: qty 1

## 2017-01-10 MED ORDER — DIPHENHYDRAMINE HCL 50 MG/ML IJ SOLN
12.5000 mg | INTRAMUSCULAR | Status: DC | PRN
  Administered 2017-01-10: 12.5 mg via INTRAVENOUS
  Filled 2017-01-10: qty 1

## 2017-01-10 MED ORDER — MORPHINE SULFATE (PF) 0.5 MG/ML IJ SOLN
INTRAMUSCULAR | Status: DC | PRN
  Administered 2017-01-10: 1 mg via INTRAVENOUS
  Administered 2017-01-10: 4 mg via EPIDURAL

## 2017-01-10 MED ORDER — OXYTOCIN 40 UNITS IN LACTATED RINGERS INFUSION - SIMPLE MED: 2.5000 [IU]/h | INTRAVENOUS | Status: DC

## 2017-01-10 MED ORDER — OXYCODONE HCL 5 MG/5ML PO SOLN: 5.0000 mg | Freq: Once | ORAL | Status: DC | PRN

## 2017-01-10 MED ORDER — ERYTHROMYCIN 5 MG/GM OP OINT
TOPICAL_OINTMENT | OPHTHALMIC | Status: AC
  Filled 2017-01-10: qty 1

## 2017-01-10 MED ORDER — OXYCODONE-ACETAMINOPHEN 5-325 MG PO TABS: 2.0000 | ORAL_TABLET | ORAL | Status: DC | PRN

## 2017-01-10 MED ORDER — MAGNESIUM SULFATE 40 G IN LACTATED RINGERS - SIMPLE
2.0000 g/h | INTRAVENOUS | Status: DC
  Administered 2017-01-10 – 2017-01-11 (×2): 2 g/h via INTRAVENOUS
  Filled 2017-01-10 (×2): qty 40

## 2017-01-10 MED ORDER — LACTATED RINGERS IV SOLN
INTRAVENOUS | Status: DC | PRN
  Administered 2017-01-10: 21:00:00 via INTRAVENOUS

## 2017-01-10 MED ORDER — MORPHINE SULFATE (PF) 0.5 MG/ML IJ SOLN
INTRAMUSCULAR | Status: AC
  Filled 2017-01-10: qty 10

## 2017-01-10 MED ORDER — PHENYLEPHRINE 40 MCG/ML (10ML) SYRINGE FOR IV PUSH (FOR BLOOD PRESSURE SUPPORT)
80.0000 ug | PREFILLED_SYRINGE | INTRAVENOUS | Status: DC | PRN
  Administered 2017-01-10: 80 ug via INTRAVENOUS
  Filled 2017-01-10: qty 10

## 2017-01-10 MED ORDER — CEFAZOLIN SODIUM-DEXTROSE 2-3 GM-%(50ML) IV SOLR
INTRAVENOUS | Status: AC
  Filled 2017-01-10: qty 50

## 2017-01-10 MED ORDER — LACTATED RINGERS IV SOLN
INTRAVENOUS | Status: DC | PRN
  Administered 2017-01-10: 40 [IU] via INTRAVENOUS

## 2017-01-10 MED ORDER — LIDOCAINE-EPINEPHRINE (PF) 2 %-1:200000 IJ SOLN
INTRAMUSCULAR | Status: DC | PRN
  Administered 2017-01-10 (×2): 5 mL via EPIDURAL

## 2017-01-10 MED ORDER — LACTATED RINGERS IV SOLN
INTRAVENOUS | Status: DC | PRN
  Administered 2017-01-10: 22:00:00 via INTRAVENOUS

## 2017-01-10 MED ORDER — MAGNESIUM SULFATE 40 G IN LACTATED RINGERS - SIMPLE
INTRAVENOUS | Status: DC | PRN
  Administered 2017-01-10: 2 g/h via INTRAVENOUS

## 2017-01-10 MED ORDER — TERBUTALINE SULFATE 1 MG/ML IJ SOLN: 0.2500 mg | Freq: Once | INTRAMUSCULAR | Status: DC | PRN

## 2017-01-10 MED ORDER — DEXTROSE 5 % IV SOLN
5.0000 10*6.[IU] | Freq: Once | INTRAVENOUS | Status: AC
  Administered 2017-01-10: 5 10*6.[IU] via INTRAVENOUS
  Filled 2017-01-10: qty 5

## 2017-01-10 MED ORDER — HYDRALAZINE HCL 20 MG/ML IJ SOLN: 10.0000 mg | Freq: Once | INTRAMUSCULAR | Status: DC | PRN

## 2017-01-10 MED ORDER — PROMETHAZINE HCL 25 MG/ML IJ SOLN: 6.2500 mg | INTRAMUSCULAR | Status: DC | PRN

## 2017-01-10 MED ORDER — ACETAMINOPHEN 325 MG PO TABS: 650.0000 mg | ORAL_TABLET | ORAL | Status: DC | PRN

## 2017-01-10 MED ORDER — SOD CITRATE-CITRIC ACID 500-334 MG/5ML PO SOLN
30.0000 mL | ORAL | Status: DC | PRN
  Administered 2017-01-10: 30 mL via ORAL
  Filled 2017-01-10: qty 15

## 2017-01-10 MED ORDER — HYDROMORPHONE HCL 1 MG/ML IJ SOLN
0.2500 mg | INTRAMUSCULAR | Status: DC | PRN
  Administered 2017-01-10 (×2): 0.5 mg via INTRAVENOUS

## 2017-01-10 MED ORDER — LABETALOL HCL 5 MG/ML IV SOLN
20.0000 mg | INTRAVENOUS | Status: DC | PRN
  Administered 2017-01-10: 20 mg via INTRAVENOUS
  Filled 2017-01-10: qty 4

## 2017-01-10 MED ORDER — MEPERIDINE HCL 25 MG/ML IJ SOLN
INTRAMUSCULAR | Status: AC
Start: 2017-01-10 — End: 2017-01-10
  Filled 2017-01-10: qty 1

## 2017-01-10 MED ORDER — PHENYLEPHRINE 40 MCG/ML (10ML) SYRINGE FOR IV PUSH (FOR BLOOD PRESSURE SUPPORT): 80.0000 ug | PREFILLED_SYRINGE | INTRAVENOUS | Status: DC | PRN

## 2017-01-10 MED ORDER — OXYTOCIN 10 UNIT/ML IJ SOLN
INTRAMUSCULAR | Status: AC
  Filled 2017-01-10: qty 4

## 2017-01-10 MED ORDER — MEPERIDINE HCL 25 MG/ML IJ SOLN
INTRAMUSCULAR | Status: DC | PRN
  Administered 2017-01-10: 25 mg via INTRAVENOUS

## 2017-01-10 MED ORDER — ONDANSETRON HCL 4 MG/2ML IJ SOLN
INTRAMUSCULAR | Status: AC
  Filled 2017-01-10: qty 2

## 2017-01-10 MED ORDER — LACTATED RINGERS IV SOLN
INTRAVENOUS | Status: DC
  Administered 2017-01-10: 09:00:00 via INTRAVENOUS

## 2017-01-10 MED ORDER — VITAMIN K1 1 MG/0.5ML IJ SOLN
INTRAMUSCULAR | Status: AC
  Filled 2017-01-10: qty 0.5

## 2017-01-10 MED ORDER — ONDANSETRON HCL 4 MG/2ML IJ SOLN
INTRAMUSCULAR | Status: DC | PRN
  Administered 2017-01-10: 4 mg via INTRAVENOUS

## 2017-01-10 MED ORDER — CEFAZOLIN SODIUM-DEXTROSE 2-4 GM/100ML-% IV SOLN
2.0000 g | INTRAVENOUS | Status: AC
  Administered 2017-01-10: 2 g via INTRAVENOUS

## 2017-01-10 MED ORDER — FENTANYL 2.5 MCG/ML BUPIVACAINE 1/10 % EPIDURAL INFUSION (WH - ANES)
14.0000 mL/h | INTRAMUSCULAR | Status: DC | PRN
  Administered 2017-01-10: 14 mL/h via EPIDURAL
  Filled 2017-01-10: qty 100

## 2017-01-10 MED ORDER — MAGNESIUM SULFATE BOLUS VIA INFUSION
4.0000 g | Freq: Once | INTRAVENOUS | Status: AC
  Administered 2017-01-10: 4 g via INTRAVENOUS
  Filled 2017-01-10: qty 500

## 2017-01-10 MED ORDER — LIDOCAINE HCL (PF) 1 % IJ SOLN: 30.0000 mL | INTRAMUSCULAR | Status: DC | PRN

## 2017-01-10 SURGICAL SUPPLY — 39 items
BENZOIN TINCTURE PRP APPL 2/3 (GAUZE/BANDAGES/DRESSINGS) ×3 IMPLANT
CHLORAPREP W/TINT 26ML (MISCELLANEOUS) ×3 IMPLANT
CLAMP CORD UMBIL (MISCELLANEOUS) ×3 IMPLANT
CLOSURE WOUND 1/2 X4 (GAUZE/BANDAGES/DRESSINGS) ×1
CLOTH BEACON ORANGE TIMEOUT ST (SAFETY) ×3 IMPLANT
DERMABOND ADVANCED (GAUZE/BANDAGES/DRESSINGS) ×2
DERMABOND ADVANCED .7 DNX12 (GAUZE/BANDAGES/DRESSINGS) ×1 IMPLANT
DRSG OPSITE POSTOP 4X10 (GAUZE/BANDAGES/DRESSINGS) ×3 IMPLANT
ELECT REM PT RETURN 9FT ADLT (ELECTROSURGICAL) ×3
ELECTRODE REM PT RTRN 9FT ADLT (ELECTROSURGICAL) ×1 IMPLANT
EXTRACTOR VACUUM KIWI (MISCELLANEOUS) IMPLANT
GLOVE BIO SURGEON STRL SZ 6.5 (GLOVE) ×2 IMPLANT
GLOVE BIO SURGEONS STRL SZ 6.5 (GLOVE) ×1
GLOVE BIOGEL PI IND STRL 6.5 (GLOVE) ×1 IMPLANT
GLOVE BIOGEL PI IND STRL 7.0 (GLOVE) ×2 IMPLANT
GLOVE BIOGEL PI INDICATOR 6.5 (GLOVE) ×2
GLOVE BIOGEL PI INDICATOR 7.0 (GLOVE) ×4
GOWN STRL REUS W/TWL LRG LVL3 (GOWN DISPOSABLE) ×6 IMPLANT
KIT ABG SYR 3ML LUER SLIP (SYRINGE) ×3 IMPLANT
NEEDLE HYPO 25X5/8 SAFETYGLIDE (NEEDLE) ×3 IMPLANT
NS IRRIG 1000ML POUR BTL (IV SOLUTION) ×3 IMPLANT
PACK C SECTION WH (CUSTOM PROCEDURE TRAY) ×3 IMPLANT
PAD ABD 8X10 STRL (GAUZE/BANDAGES/DRESSINGS) ×3 IMPLANT
PAD OB MATERNITY 4.3X12.25 (PERSONAL CARE ITEMS) ×3 IMPLANT
PENCIL SMOKE EVAC W/HOLSTER (ELECTROSURGICAL) ×3 IMPLANT
RETRACTOR WND ALEXIS 25 LRG (MISCELLANEOUS) ×1 IMPLANT
RTRCTR WOUND ALEXIS 25CM LRG (MISCELLANEOUS) ×3
SPONGE GAUZE 4X4 12PLY (GAUZE/BANDAGES/DRESSINGS) ×6 IMPLANT
STRIP CLOSURE SKIN 1/2X4 (GAUZE/BANDAGES/DRESSINGS) ×2 IMPLANT
SUT PLAIN 0 NONE (SUTURE) IMPLANT
SUT PLAIN 2 0 (SUTURE) ×4
SUT PLAIN ABS 2-0 CT1 27XMFL (SUTURE) ×2 IMPLANT
SUT VIC AB 0 CT1 36 (SUTURE) ×3 IMPLANT
SUT VIC AB 0 CTX 36 (SUTURE) ×4
SUT VIC AB 0 CTX36XBRD ANBCTRL (SUTURE) ×2 IMPLANT
SUT VIC AB 4-0 KS 27 (SUTURE) ×3 IMPLANT
SUT VIC AB 4-0 PS2 27 (SUTURE) ×3 IMPLANT
TOWEL OR 17X24 6PK STRL BLUE (TOWEL DISPOSABLE) ×3 IMPLANT
TRAY FOLEY BAG SILVER LF 14FR (SET/KITS/TRAYS/PACK) IMPLANT

## 2017-01-10 NOTE — Transfer of Care (Signed)
Immediate Anesthesia Transfer of Care Note  Patient: Christina Freeman  Procedure(s) Performed: CESAREAN SECTION (N/A )  Patient Location: PACU  Anesthesia Type:Epidural  Level of Consciousness: awake, alert  and oriented  Airway & Oxygen Therapy: Patient Spontanous Breathing  Post-op Assessment: Report given to RN and Post -op Vital signs reviewed and stable  Post vital signs: Reviewed and stable  Last Vitals:  Vitals:   01/10/17 2031 01/10/17 2101  BP: 125/65 122/72  Pulse: (!) 124 (!) 120  Resp: 18 16  Temp:    SpO2:      Last Pain:  Vitals:   01/10/17 2031  TempSrc:   PainSc: 0-No pain      Patients Stated Pain Goal: 8 (01/10/17 0944)  Complications: No apparent anesthesia complications

## 2017-01-10 NOTE — Brief Op Note (Signed)
01/10/2017  10:12 PM  PATIENT:  Christina Freeman  21 y.o. female  PRE-OPERATIVE DIAGNOSIS:  primary cesarean section for arrest of dilation  POST-OPERATIVE DIAGNOSIS:  primary cesarean section for arrest of dilation  PROCEDURE:  Procedure(s): CESAREAN SECTION (N/A)  SURGEON:  Surgeon(s) and Role:    * Ranae PilaLeger, Elise Jennifer, MD - Primary  PHYSICIAN ASSISTANT:   ASSISTANTS: none   ANESTHESIA:   epidural  EBL:  707 mL   BLOOD ADMINISTERED:none  DRAINS: Urinary Catheter (Foley)   LOCAL MEDICATIONS USED:  NONE  SPECIMEN:  Source of Specimen:  placenta  DISPOSITION OF SPECIMEN:  L&D  COUNTS:  YES  TOURNIQUET:  * No tourniquets in log *  DICTATION: .Note written in EPIC  PLAN OF CARE: Admit to inpatient   PATIENT DISPOSITION:  PACU - hemodynamically stable.   Delay start of Pharmacological VTE agent (>24hrs) due to surgical blood loss or risk of bleeding: not applicable

## 2017-01-10 NOTE — H&P (Signed)
Christina Freeman is a 21 y.o. female presenting for SROM. +  Fern and pooling. Bps elevated - was scheduled for IOL this week s/sgHTN. OB History    Gravida Para Term Preterm AB Living   2 0 0 0 1 0   SAB TAB Ectopic Multiple Live Births   1 0 0 0 0     Past Medical History:  Diagnosis Date  . Anxiety   . Depression   . History of posttraumatic stress disorder (PTSD)   . History of self-harm    cutting  last time approx March 2018  . Hx of acute pyelonephritis   . Pregnancy induced hypertension    Past Surgical History:  Procedure Laterality Date  . NO PAST SURGERIES    . tubes in ears     Family History: family history includes Diabetes in her father; Hypertension in her father. Social History:  reports that  has never smoked. she has never used smokeless tobacco. She reports that she uses drugs. Drug: Marijuana. She reports that she does not drink alcohol.     Maternal Diabetes: No Genetic Screening: Normal Maternal Ultrasounds/Referrals: Normal Fetal Ultrasounds or other Referrals:  None Maternal Substance Abuse:  No Significant Maternal Medications:  None Significant Maternal Lab Results:  None Other Comments:  None  ROS History Dilation: 4 Effacement (%): 70 Station: -3 Exam by:: Thelonious Kauffmann Blood pressure (!) 102/55, pulse (!) 103, temperature 98.8 F (37.1 C), temperature source Oral, resp. rate 20, last menstrual period 01/09/2016, SpO2 99 %, unknown if currently breastfeeding. Exam Physical Exam  NAD, A&O NWOB Abd soft, nondistended, gravid SVE above  Prenatal labs: ABO, Rh: --/--/A POS (11/19 0900) Antibody: NEG (11/19 0900) Rubella: Immune (05/01 0000) RPR: Nonreactive (05/01 0000)  HBsAg: Negative (05/01 0000)  HIV: Non-reactive (05/01 0000)  GBS: Positive (11/13 0000)   Assessment/Plan: 21yo G2P0 @ 37.1wga presenting with SROM and pre-eclampsia with severe features. S/p IV labetalol 20mg . PIH labs WNL. Magnesium initiated and BP improved. No other s/s  severe features currently. Was not in labor -on presentation 1cm dilated. Pitocin initiated and now 4/70/-3, vertex. Forebag ruptured for clear fluid. Continue pitocin titration. EFW 7#6. Cat 1 tracing. GBS pos. PCN per protocol.   Christina Freeman Christina Freeman 01/10/2017, 2:07 PM

## 2017-01-10 NOTE — Anesthesia Pain Management Evaluation Note (Signed)
  CRNA Pain Management Visit Note  Patient: Christina Freeman, 21 y.o., female  "Hello I am a [facility] the anesthesia [facility]. We have an anesthesia team available at all times to provide care throughout the hospital, including epidural management and anesthesia for C-section. I don't know your plan for the delivery whether it a natural birth, water birth, IV sedation, nitrous supplementation, doula or epidural, but we want to meet your pain goals."   1.Was your pain managed to your expectations on prior hospitalizations?   Yes   2.What is your expectation for pain management during this hospitalization?     Epidural  3.How can we help you reach that goal? epidural  Record the patient's initial score and the patient's pain goal.   Pain: 5  Pain Goal: 8 [facility] wants you to be able to say your pain was always managed very well.  Cleda ClarksBrowder, Davyd Podgorski R 01/10/2017

## 2017-01-10 NOTE — MAU Note (Signed)
Urine in lab. Culture obtained. 

## 2017-01-10 NOTE — MAU Note (Addendum)
LOF around 0700, clear fluid, still trickling. Felt cramps and now feeling ctx. Reports bloody show. +FM. States she was going to be induced tomorrow for HTN

## 2017-01-10 NOTE — Progress Notes (Signed)
SVE unchanged, fetal vertex remains -3 and now with significant caput. IUPC placed ~ 2 hours ago. CTM closely.   Rosie FateE Leger MD

## 2017-01-10 NOTE — Anesthesia Preprocedure Evaluation (Signed)
Anesthesia Evaluation  Patient identified by MRN, date of birth, ID band Patient awake    Reviewed: Allergy & Precautions, NPO status , Patient's Chart, lab work & pertinent test results  History of Anesthesia Complications Negative for: history of anesthetic complications  Airway Mallampati: II  TM Distance: >3 FB Neck ROM: Full    Dental no notable dental hx. (+) Dental Advisory Given   Pulmonary neg pulmonary ROS,    Pulmonary exam normal        Cardiovascular hypertension, Pt. on medications negative cardio ROS Normal cardiovascular exam     Neuro/Psych negative neurological ROS  negative psych ROS   GI/Hepatic negative GI ROS, Neg liver ROS,   Endo/Other  Morbid obesity  Renal/GU negative Renal ROS  negative genitourinary   Musculoskeletal negative musculoskeletal ROS (+)   Abdominal   Peds negative pediatric ROS (+)  Hematology negative hematology ROS (+)   Anesthesia Other Findings   Reproductive/Obstetrics (+) Pregnancy                             Anesthesia Physical Anesthesia Plan  ASA: III  Anesthesia Plan: Epidural   Post-op Pain Management:    Induction:   PONV Risk Score and Plan:   Airway Management Planned: Natural Airway  Additional Equipment:   Intra-op Plan:   Post-operative Plan:   Informed Consent: I have reviewed the patients History and Physical, chart, labs and discussed the procedure including the risks, benefits and alternatives for the proposed anesthesia with the patient or authorized representative who has indicated his/her understanding and acceptance.   Dental advisory given  Plan Discussed with: Anesthesiologist  Anesthesia Plan Comments:         Anesthesia Quick Evaluation

## 2017-01-10 NOTE — Progress Notes (Signed)
SVE remains unchanged, still 4cm x >6 hours with adequate ctxn on IUPC overall. Cervix swollen, fetal vertex -3 and with significant caput. Discussed continued VTOL vs cesarean section. Patient opts for C section. Risks discussed at length including infection, bleeding, damage to the surrounding structures, and the need for additional procedures. Questions answered in detail and consent signed. 2g Ancef on call to OR.     Rosie FateE Ayaka Andes MD

## 2017-01-10 NOTE — Op Note (Signed)
PROCEDURE DATE: 01/10/17  PREOPERATIVE DIAGNOSIS: Intrauterine pregnancy at 37.1 wga, Indication: arrest of dilation  POSTOPERATIVE DIAGNOSIS:The same  PROCEDURE: Primary Low TransverseCesarean Section  SURGEON: Dr. Belva AgeeElise Kieran Nachtigal  INDICATIONS:This is a 21yo G2P0 at 4037.1 wga requiring cesarean section secondary to arrest of dilation. She presented s/p SROM. Progressed quickly to 4cm. However, then stalled at 4cm >6hours with adequate contractions. Decision made to proceed with pLTCS.The risks of cesarean section discussed with the patient included but were not limited to: bleeding which may require transfusion or reoperation; infection which may require antibiotics; injury to bowel, bladder, ureters or other surrounding organs; injury to the fetus; need for additional procedures including hysterectomy in the event of a life-threatening hemorrhage; placental abnormalities wth subsequent pregnancies, incisional problems, thromboembolic phenomenon and other postoperative/anesthesia complications. The patient agreed with the proposed plan, giving informed consent for the procedure.   FINDINGS: Viable maleinfant in vertex presentation,APGARs pending, Weight pending, Amniotic fluid clear, Intact placenta, three vessel cord. Grossly normal uterus, ovaries and fallopian tubes. .  ANESTHESIA: Epidural ESTIMATED BLOOD LOSS: 707cc SPECIMENS: Placenta for L&D COMPLICATIONS: None immediate   PROCEDURE IN DETAIL: The patient received intravenous antibiotics (2g Ancef) and had sequential compression devices applied to her lower extremities while in the preoperative area. Shewasthen taken to the operating roomwhere epidural anesthesiawas dosed up to surgical level andwas found to be adequate. She was then placed in a dorsal supine position with a leftward tilt,and prepped and draped in a sterile manner.A foley catheter was placed into her bladder and attached to constant gravity.  After an adequate timeout was performed, aPfannenstiel skin incision was made with scalpel and carried through to the underlying layer of fascia. The fascia was incised in the midline and this incision was extended bilaterally using the Mayo scissors. Kocher clamps were applied to the superior aspect of the fascial incision and the underlying rectus muscles were dissected off bluntly. A similar process was carried out on the inferior aspect of the facial incision. The rectus muscles were separated in the midline bluntly and the peritoneum was entered bluntly. A bladder flap was created sharply and developed bluntly.Atransverse hysterotomy was made with a scalpel and extended bilaterally bluntly. The bladder blade was then removed. The infant was successfully delivered, and cord was clamped and cut and infant was handed over to awaiting neonatology team. Uterine massage was then administered and the placenta delivered intact with three-vessel cord. Cord gases were taken. The uterus was cleared of clot and debris. The hysterotomy was closed with 0 vicryl.A second imbricating suture of 0-vicryl was used to reinforce the incision and aid in hemostasis.The fascia was closed with 0-Vicryl in a running fashion with good restoration of anatomy. The subcutaneus tissue was irrigated and was reapproximated using three interrupted plain gut stitches. The skin was closed with 4-0 Vicryl in a subcuticular fashion.  Final EBL was 707cc (all surgical site and was hemostatic at end of procedure) without any further bleeding on exam.   Pt tolerated the procedure well. All sponge/lap/needle counts were correct X 2. Pt taken to recovery room in stable condition.  It's a boy "Laroy AppleKent Jacob"!!     Belva AgeeElise Porschia Willbanks MD

## 2017-01-10 NOTE — Anesthesia Procedure Notes (Signed)
Epidural Patient location during procedure: OB Start time: 01/10/2017 12:24 PM End time: 01/10/2017 12:36 PM  Staffing Anesthesiologist: Heather RobertsSinger, Harald Quevedo, MD Performed: anesthesiologist   Preanesthetic Checklist Completed: patient identified, site marked, pre-op evaluation, timeout performed, IV checked, risks and benefits discussed and monitors and equipment checked  Epidural Patient position: sitting Prep: DuraPrep Patient monitoring: heart rate, cardiac monitor, continuous pulse ox and blood pressure Approach: midline Location: L2-L3 Injection technique: LOR saline  Needle:  Needle type: Tuohy  Needle gauge: 17 G Needle length: 9 cm Needle insertion depth: 8 cm Catheter size: 20 Guage Catheter at skin depth: 12 cm Test dose: negative and Other  Assessment Events: blood not aspirated, injection not painful, no injection resistance and negative IV test  Additional Notes Informed consent obtained prior to proceeding including risk of failure, 1% risk of PDPH, risk of minor discomfort and bruising.  Discussed rare but serious complications including epidural abscess, permanent nerve injury, epidural hematoma.  Discussed alternatives to epidural analgesia and patient desires to proceed.  Timeout performed pre-procedure verifying patient name, procedure, and platelet count.  Patient tolerated procedure well.

## 2017-01-11 ENCOUNTER — Encounter (HOSPITAL_COMMUNITY): Payer: Self-pay | Admitting: Obstetrics and Gynecology

## 2017-01-11 LAB — CBC
HEMATOCRIT: 31.4 % — AB (ref 36.0–46.0)
HEMOGLOBIN: 10.2 g/dL — AB (ref 12.0–15.0)
MCH: 29.1 pg (ref 26.0–34.0)
MCHC: 32.5 g/dL (ref 30.0–36.0)
MCV: 89.5 fL (ref 78.0–100.0)
Platelets: 263 10*3/uL (ref 150–400)
RBC: 3.51 MIL/uL — ABNORMAL LOW (ref 3.87–5.11)
RDW: 14.6 % (ref 11.5–15.5)
WBC: 18.7 10*3/uL — ABNORMAL HIGH (ref 4.0–10.5)

## 2017-01-11 LAB — RPR: RPR: NONREACTIVE

## 2017-01-11 MED ORDER — OXYCODONE HCL 5 MG PO TABS
5.0000 mg | ORAL_TABLET | ORAL | Status: DC | PRN
  Administered 2017-01-12 – 2017-01-13 (×3): 5 mg via ORAL
  Filled 2017-01-11 (×3): qty 1

## 2017-01-11 MED ORDER — SIMETHICONE 80 MG PO CHEW
80.0000 mg | CHEWABLE_TABLET | ORAL | Status: DC
  Administered 2017-01-11 – 2017-01-12 (×3): 80 mg via ORAL
  Filled 2017-01-11 (×3): qty 1

## 2017-01-11 MED ORDER — OXYTOCIN 40 UNITS IN LACTATED RINGERS INFUSION - SIMPLE MED: 2.5000 [IU]/h | INTRAVENOUS | Status: AC

## 2017-01-11 MED ORDER — LACTATED RINGERS IV SOLN
INTRAVENOUS | Status: DC
  Administered 2017-01-11 (×2): via INTRAVENOUS

## 2017-01-11 MED ORDER — MENTHOL 3 MG MT LOZG: 1.0000 | LOZENGE | OROMUCOSAL | Status: DC | PRN

## 2017-01-11 MED ORDER — OXYCODONE HCL 5 MG PO TABS
10.0000 mg | ORAL_TABLET | ORAL | Status: DC | PRN
  Administered 2017-01-12 – 2017-01-13 (×3): 10 mg via ORAL
  Filled 2017-01-11 (×3): qty 2

## 2017-01-11 MED ORDER — SENNOSIDES-DOCUSATE SODIUM 8.6-50 MG PO TABS
2.0000 | ORAL_TABLET | ORAL | Status: DC
  Administered 2017-01-11 – 2017-01-12 (×3): 2 via ORAL
  Filled 2017-01-11 (×3): qty 2

## 2017-01-11 MED ORDER — COCONUT OIL OIL
1.0000 "application " | TOPICAL_OIL | Status: DC | PRN
  Filled 2017-01-11: qty 120

## 2017-01-11 MED ORDER — ACETAMINOPHEN 325 MG PO TABS
650.0000 mg | ORAL_TABLET | ORAL | Status: DC | PRN
  Administered 2017-01-12 – 2017-01-13 (×3): 650 mg via ORAL
  Filled 2017-01-11 (×3): qty 2

## 2017-01-11 MED ORDER — DIBUCAINE 1 % RE OINT: 1.0000 "application " | TOPICAL_OINTMENT | RECTAL | Status: DC | PRN

## 2017-01-11 MED ORDER — DIPHENHYDRAMINE HCL 25 MG PO CAPS
25.0000 mg | ORAL_CAPSULE | Freq: Four times a day (QID) | ORAL | Status: DC | PRN
  Administered 2017-01-11: 25 mg via ORAL
  Filled 2017-01-11: qty 1

## 2017-01-11 MED ORDER — ZOLPIDEM TARTRATE 5 MG PO TABS: 5.0000 mg | ORAL_TABLET | Freq: Every evening | ORAL | Status: DC | PRN

## 2017-01-11 MED ORDER — ONDANSETRON HCL 4 MG/2ML IJ SOLN
4.0000 mg | Freq: Four times a day (QID) | INTRAMUSCULAR | Status: DC | PRN
  Administered 2017-01-11: 4 mg via INTRAVENOUS
  Filled 2017-01-11: qty 2

## 2017-01-11 MED ORDER — ONDANSETRON HCL 4 MG/2ML IJ SOLN: 4.0000 mg | Freq: Four times a day (QID) | INTRAMUSCULAR | Status: DC

## 2017-01-11 MED ORDER — PRENATAL MULTIVITAMIN CH
1.0000 | ORAL_TABLET | Freq: Every day | ORAL | Status: DC
  Administered 2017-01-11 – 2017-01-13 (×3): 1 via ORAL
  Filled 2017-01-11 (×3): qty 1

## 2017-01-11 MED ORDER — IBUPROFEN 600 MG PO TABS
600.0000 mg | ORAL_TABLET | Freq: Four times a day (QID) | ORAL | Status: DC
  Administered 2017-01-11 – 2017-01-13 (×11): 600 mg via ORAL
  Filled 2017-01-11 (×11): qty 1

## 2017-01-11 MED ORDER — TETANUS-DIPHTH-ACELL PERTUSSIS 5-2.5-18.5 LF-MCG/0.5 IM SUSP: 0.5000 mL | Freq: Once | INTRAMUSCULAR | Status: DC

## 2017-01-11 MED ORDER — WITCH HAZEL-GLYCERIN EX PADS: 1.0000 "application " | MEDICATED_PAD | CUTANEOUS | Status: DC | PRN

## 2017-01-11 MED ORDER — SIMETHICONE 80 MG PO CHEW: 80.0000 mg | CHEWABLE_TABLET | ORAL | Status: DC | PRN

## 2017-01-11 MED ORDER — SIMETHICONE 80 MG PO CHEW
80.0000 mg | CHEWABLE_TABLET | Freq: Three times a day (TID) | ORAL | Status: DC
  Administered 2017-01-11 – 2017-01-13 (×7): 80 mg via ORAL
  Filled 2017-01-11 (×7): qty 1

## 2017-01-11 NOTE — Anesthesia Postprocedure Evaluation (Signed)
Anesthesia Post Note  Patient: Christina Freeman  Procedure(s) Performed: CESAREAN SECTION (N/A )     Patient location during evaluation: [facility] Anesthesia Type: Epidural Level of consciousness: awake and alert Pain management: pain level controlled Vital Signs Assessment: post-procedure vital signs reviewed and stable Respiratory status: spontaneous breathing, nonlabored ventilation and respiratory function stable Cardiovascular status: stable Postop Assessment: no headache, no backache and epidural receding Anesthetic complications: no    Last Vitals:  Vitals:   01/11/17 0600 01/11/17 0655  BP: (!) 111/57 (!) 117/59  Pulse: 97 90  Resp: 18   Temp: 36.5 C   SpO2: 97%     Last Pain:  Vitals:   01/11/17 0510  TempSrc: Oral  PainSc: 0-No pain   Pain Goal: Patients Stated Pain Goal: 8 (01/10/17 0944)               Marrion CoyMERRITT,Lashan Macias

## 2017-01-11 NOTE — Progress Notes (Signed)
Subjective: Postpartum Day 1: Cesarean Delivery Patient reports tolerating PO.    Objective: Vital signs in last 24 hours: Temp:  [97.7 F (36.5 C)-99 F (37.2 C)] 97.7 F (36.5 C) (11/20 0800) Pulse Rate:  [82-124] 85 (11/20 0800) Resp:  [15-29] 20 (11/20 0800) BP: (90-165)/(40-110) 121/72 (11/20 0800) SpO2:  [96 %-100 %] 98 % (11/20 0800)  Physical Exam:  General: alert, cooperative, appears stated age and no distress Lochia: appropriate Uterine Fundus: firm Incision: healing well DVT Evaluation: No evidence of DVT seen on physical exam. DTRs 1/4  Recent Labs    01/10/17 2305 01/11/17 0533  HGB 10.5* 10.2*  HCT 33.8* 31.4*    Assessment/Plan: Status post Cesarean section. Postoperative course complicated by Gest HTN with severe features on Mag (BP)  BPs stable after delivery on no meds, Cont Mag x 24 hours.  Labs normal.  Christina Freeman C 01/11/2017, 9:29 AM

## 2017-01-11 NOTE — Progress Notes (Signed)
Pt reports nausea and vomiting- Dr. Rana SnareLowe called. Verbal order for 4mg  Zofran q6 ordered.

## 2017-01-12 ENCOUNTER — Inpatient Hospital Stay (HOSPITAL_COMMUNITY): Payer: BLUE CROSS/BLUE SHIELD

## 2017-01-12 ENCOUNTER — Other Ambulatory Visit: Payer: Self-pay

## 2017-01-12 NOTE — Progress Notes (Signed)
CSW acknowledges consult.  CSW attempted to meet with MOB, however MOB had several room guest.  CSW will attempt to visit with MOB at a later time.   CSW also attempted to meet with MOB on 11/210/2018, however, MOB was on Mag.  Blaine HamperAngel Boyd-Gilyard, MSW, LCSW Clinical Social Work [phone]

## 2017-01-12 NOTE — Plan of Care (Signed)
Pt. Doing well bonding with baby and breast feeding (see lactation note) VSS. Mag gtt turned off at 2000. Bleeding minimal. Increase in incisional pain post increased activity. PRN meds given. Pt. Reminded to express pain needs with assessment or to call before pain reaches a heightened level. Pt. In agreement. Will continue to monitor.

## 2017-01-12 NOTE — Progress Notes (Signed)
Subjective: Postpartum Day 2: Cesarean Delivery Patient reports tolerating PO.    Objective: Vital signs in last 24 hours: Temp:  [97.7 F (36.5 C)-98.5 F (36.9 C)] 98.4 F (36.9 C) (11/21 0511) Pulse Rate:  [85-100] 87 (11/21 0511) Resp:  [18-20] 18 (11/21 0511) BP: (105-145)/(63-74) 145/71 (11/21 0511) SpO2:  [98 %-100 %] 100 % (11/21 0511)  Physical Exam:  General: alert Lochia: appropriate Uterine Fundus: firm Incision: healing well DVT Evaluation: No evidence of DVT seen on physical exam.  Recent Labs    01/10/17 2305 01/11/17 0533  HGB 10.5* 10.2*  HCT 33.8* 31.4*   CBC    Component Value Date/Time   WBC 18.7 (H) 01/11/2017 0533   RBC 3.51 (L) 01/11/2017 0533   HGB 10.2 (L) 01/11/2017 0533   HCT 31.4 (L) 01/11/2017 0533   PLT 263 01/11/2017 0533   MCV 89.5 01/11/2017 0533   MCH 29.1 01/11/2017 0533   MCHC 32.5 01/11/2017 0533   RDW 14.6 01/11/2017 0533   Hepatic Function Panel     Component Value Date/Time   PROT 6.2 (L) 01/10/2017 0900   ALBUMIN 2.6 (L) 01/10/2017 0900   AST 16 01/10/2017 0900   ALT 9 (L) 01/10/2017 0900   ALKPHOS 119 01/10/2017 0900   BILITOT 0.2 (L) 01/10/2017 0900   Assessment/Plan: Status post Cesarean section. Doing well postoperatively.  Continue current care Off mag since last PM, plan D/C in am.  Meriel Picaichard M Raffaele Derise 01/12/2017, 7:49 AM

## 2017-01-12 NOTE — Lactation Note (Addendum)
This note was copied from a baby's chart. Lactation Consultation Note Baby 30 hrs old. Mom stated baby has been BF well. Stated it hurts occasionally while BF. Mom has semi flat nipples. Very short shaft. Everts well w/stimulation. Shells given.  Breast are starting to fill. Hand expression taught w/easy flow of colostrum. Expressed 10 ml while baby BF on opposite breast.  Noted baby when he cried had tight frenulum and upper labial thick frenulum. Asked mom to show Dr.  Joan FloresBaby's lips dry, chapped, and peeling. Oral mucosa moist. Baby had has 4 stools and 6 voids in 30 hrs of age. WDL.  Mom stated she had been BF in football position. Assisted in latching. Encouraged comfort and support during feeding. Watched mom latch baby. LC asked mom if that is the way the baby has been BF, mom stated yes. Baby was VERY shallow, barely had nipple in the mouth, just suckling on tip of nipple. Discussed why and how the baby should obtain a deep latch. Assisted in latching. Mom stated BIG difference. Denied painful latch. Nipples intact no break down.  Mom assessed breast when latched and after BF. Notable difference in soften. Baby was GULPING at the breast. Mom stated he hasn't been feeding like that before and [facility]. Encouraged to occasionally massage breast at intervals while feeding unless baby acts like its to much. Educated on newborn behavior, feeding habits, STS, I&o, cluster feeding, supply and demand. Mom has DEBP at home. Mom encouraged to feed baby 8-12 times/24 hours and with feeding cues. Discussed breast filling, engorgement, management, milk storage, clogged duct, pumping and bottle feeding occasionally.  Encouraged to call if has questions or needs assistance.  WH/LC brochure given w/resources, support groups and LC services. Patient Name: Christina Autumn MessingMorgan Mapel ZOXWR'UToday's Date: 01/12/2017 Reason for consult: Initial assessment   Maternal Data Has patient been taught Hand Expression?:  Yes Does the patient have breastfeeding experience prior to this delivery?: No  Feeding Feeding Type: Breast Fed Length of feed: 35 min(still BF)  LATCH Score Latch: Grasps breast easily, tongue down, lips flanged, rhythmical sucking.  Audible Swallowing: Spontaneous and intermittent  Type of Nipple: Everted at rest and after stimulation  Comfort (Breast/Nipple): Filling, red/small blisters or bruises, mild/mod discomfort  Hold (Positioning): Assistance needed to correctly position infant at breast and maintain latch.  LATCH Score: 8  Interventions Interventions: Breast feeding basics reviewed;Assisted with latch;Breast compression;Shells;Adjust position;Breast massage;Support pillows;Hand express;Position options;Expressed milk  Lactation Tools Discussed/Used Tools: Shells WIC Program: No   Consult Status Consult Status: Follow-up Date: 01/13/17 Follow-up type: In-patient    Charyl DancerCARVER, Jadis Mika G 01/12/2017, 4:44 AM

## 2017-01-13 LAB — COMPREHENSIVE METABOLIC PANEL
ALBUMIN: 2.3 g/dL — AB (ref 3.5–5.0)
ALT: 10 U/L — ABNORMAL LOW (ref 14–54)
ANION GAP: 7 (ref 5–15)
AST: 18 U/L (ref 15–41)
Alkaline Phosphatase: 81 U/L (ref 38–126)
BILIRUBIN TOTAL: 0.4 mg/dL (ref 0.3–1.2)
BUN: 13 mg/dL (ref 6–20)
CHLORIDE: 106 mmol/L (ref 101–111)
CO2: 26 mmol/L (ref 22–32)
CREATININE: 0.67 mg/dL (ref 0.44–1.00)
Calcium: 8.4 mg/dL — ABNORMAL LOW (ref 8.9–10.3)
GFR calc Af Amer: >60 mL/min (ref >60)
GFR calc non Af Amer: >60 mL/min (ref >60)
Glucose, Bld: 84 mg/dL (ref 65–99)
Potassium: 4.5 mmol/L (ref 3.5–5.1)
Sodium: 139 mmol/L (ref 135–145)
Total Protein: 5.7 g/dL — ABNORMAL LOW (ref 6.5–8.1)

## 2017-01-13 MED ORDER — OXYCODONE HCL 5 MG PO TABS: 5.0000 mg | ORAL_TABLET | ORAL | 0 refills | Status: DC | PRN

## 2017-01-13 MED ORDER — AMOXICILLIN-POT CLAVULANATE 500-125 MG PO TABS: 1.0000 | ORAL_TABLET | Freq: Three times a day (TID) | ORAL | 0 refills | Status: DC

## 2017-01-13 MED ORDER — IBUPROFEN 600 MG PO TABS: 600.0000 mg | ORAL_TABLET | Freq: Four times a day (QID) | ORAL | 0 refills | Status: DC

## 2017-01-13 NOTE — Progress Notes (Signed)
CSW received consult for hx of Anxiety and Depression.  There is also a note in her chart about cutting.  CSW spoke with MOB over the phone today as there is not an onsite CSW to complete assessment.  MOB sounded receptive to speaking with CSW and thanked CSW for calling.  CSW could hear that MOB was tearful at times during the conversation.  She seemed very open to talking about her feelings. MOB reports that she feeling well overall at this time.  CSW offered supportive brief counseling while MOB spoke about her past.  She reports dx of PTSD from "a traumatic event," which CSW did not ask her to disclose at this time.  MOB states she started treatment in the past, but stopped.  CSW asked her to elaborate on this and she replied that she did not want to become dependent on medication to feel ok.  CSW provided in depth education regarding SSRIs vs Antianxiety medications and asked that she keep an open mind about utilizing medication if at any time she feels medication is needed/recommended.  She stated understanding that SSRIs take 4-6 weeks to reach a therapeutic level in the blood stream.   MOB denies SI and easily contracts for safety today.  She reports hx of cutting arms in March, which she was willing to talk to CSW about.  She states she felt "numb" and cut herself because she "just wanted to feel something."  She denies attempt to end her life.   CSW strongly recommends outpatient counseling at this time and asked if CSW could make a referral for MOB on Monday or if she felt she would take information and make an appointment for herself.  MOB states she will make an appointment for herself and reports that she is in agreement with CSW's recommendation.  CSW provided mental health resource information.  MOB stated appreciation and commitment to follow up.  MOB asked for CSW's number if she would like to call and talk in the future.  CSW provided phone number and explained that CSW is not available for  outpatient counseling, but is happy to talk with her in the future if she would like, or if she needs assistance linking with an outpatient counselor. CSW provided education regarding the perinatal mood disorders.  CSW recommends self-evaluation during the postpartum time period using the [place] Postnatal Depression Scale.  MOB states she took this screen while in the hospital and scored a 4.  CSW explained that scoring and asked that she notify her MD or counselor (if established at time of re-taking screen) if she scores 10 or higher.  MOB agreed.  MOB was very appreciative of the support and information offered by CSW. CSW identifies no further need for intervention and no barriers to discharge at this time.  CSW notified Barrister's clerkCN Charge RN.

## 2017-01-13 NOTE — Discharge Summary (Signed)
Obstetric Discharge Summary Reason for Admission: rupture of membranes and pre-eclampsia with severe features Prenatal Procedures: none Intrapartum Procedures: cesarean: low cervical, transverse Postpartum Procedures: none Complications-Operative and Postpartum: none Hemoglobin  Date Value Ref Range Status  01/11/2017 10.2 (L) 12.0 - 15.0 g/dL Final   HCT  Date Value Ref Range Status  01/11/2017 31.4 (L) 36.0 - 46.0 % Final    Physical Exam:  General: alert, cooperative and appears stated age 47Lochia: appropriate Uterine Fundus: firm Incision: healing well, no significant drainage, no dehiscence, possible early cellulitis DVT Evaluation: No evidence of DVT seen on physical exam. Negative Homan's sign. No cords or calf tenderness.  Discharge Diagnoses: Term Pregnancy-delivered and Preelampsia  Discharge Information: Date: 01/13/2017 Activity: pelvic rest Diet: routine Medications: PNV, Ibuprofen, Percocet and augmentin Condition: stable Instructions: refer to practice specific booklet Discharge to: home   Newborn Data: Live born female  Birth Weight: 7 lb 4.8 oz (3310 g) APGAR: 8, 8  Newborn Delivery   Birth date/time:  01/10/2017 21:38:00 Delivery type:  C-Section, Low Transverse C-section categorization:  Primary     Home with mother.  Cordon Gassett 01/13/2017, 9:28 AM

## 2017-01-13 NOTE — Progress Notes (Addendum)
Subjective: Postpartum Day 3: Cesarean Delivery Patient reports tolerating PO, + flatus and no problems voiding.  Plans outpt circ.  RN concerned re: redness and warmth at incision.  No HA, CP/SOB, RUQ pain or visual disturbance.  Objective: Vital signs in last 24 hours: Temp:  [98.1 F (36.7 C)-98.9 F (37.2 C)] 98.1 F (36.7 C) (11/22 0811) Pulse Rate:  [95-105] 99 (11/22 0811) Resp:  [17-18] 18 (11/22 0811) BP: (133-147)/(74-81) 137/76 (11/22 0811) SpO2:  [98 %-100 %] 98 % (11/22 0811)  Physical Exam:  General: alert, cooperative and appears stated age Lochia: appropriate Uterine Fundus: firm Incision: healing well, no significant drainage, no dehiscence, minimal erythema DVT Evaluation: No evidence of DVT seen on physical exam. Negative Homan's sign. No cords or calf tenderness.  Recent Labs    01/10/17 2305 01/11/17 0533  HGB 10.5* 10.2*  HCT 33.8* 31.4*    Assessment/Plan: Status post Cesarean section. Doing well postoperatively.  Discharge home with standard precautions and return to clinic in 4-6 weeks. Possible early cellulitis-d/c home with augmentin rx Pre-E with severe features-s/p postpartum magnesium.  No symptoms and BPs nl to low mild range without antihypertensives postpartum.  Christina Freeman 01/13/2017, 9:22 AM

## 2017-01-13 NOTE — Progress Notes (Signed)
Incision intact but redness and warmth around dressing noted.

## 2017-01-13 NOTE — Lactation Note (Signed)
This note was copied from a baby's chart. Lactation Consultation Note: follow up visit with mom before DC. She reports left breast is very full. Getting ready to latch baby as I went into room. Assisted mom with football position and getting a deep latch. Mom reports no pain now with latch. Baby nursed for 10 min then off to sleep. Mom reports breast feels softer after nursing. Reviewed engorgement prevention and treatment. Has DEBP at home but can not remember what brand it is. Reviewed our phone number, OP appointments and BFS[facility] as resources for support after DC. No questions at present. To call prn  Patient Name: Christina Freeman'UToday's Date: 01/13/2017 Reason for consult: Follow-up assessment   Maternal Data Formula Feeding for Exclusion: No Has patient been taught Hand Expression?: Yes Does the patient have breastfeeding experience prior to this delivery?: No  Feeding Feeding Type: Breast Fed Length of feed: 10 min  LATCH Score Latch: Grasps breast easily, tongue down, lips flanged, rhythmical sucking.  Audible Swallowing: Spontaneous and intermittent  Type of Nipple: Everted at rest and after stimulation  Comfort (Breast/Nipple): Filling, red/small blisters or bruises, mild/mod discomfort  Hold (Positioning): Assistance needed to correctly position infant at breast and maintain latch.  LATCH Score: 8  Interventions Interventions: Breast feeding basics reviewed;Assisted with latch;Hand express  Lactation Tools Discussed/Used WIC Program: No   Consult Status Consult Status: Complete    Pamelia HoitWeeks, Valeska Haislip D 01/13/2017, 8:04 AM

## 2017-01-13 NOTE — Discharge Instructions (Signed)
Call MD for T>100.4, heavy vaginal bleeding, severe abdominal pain, intractable nausea and/or vomiting, or respiratory distress.  Call office to schedule postop incision and BP check in 1 week.  No driving while taking narcotics.  Pelvic rest x 6 weeks.

## 2017-02-13 IMAGING — US US OB TRANSVAGINAL
1 series · 15 of 28 positions shown · non-contrast
Comparison: None.

CLINICAL DATA: Vaginal bleeding with positive pregnancy test

EXAM:
OBSTETRIC <14 WK US AND TRANSVAGINAL OB US
TECHNIQUE: Both transabdominal and transvaginal ultrasound examinations were
performed for complete evaluation of the gestation as well as the
maternal uterus, adnexal regions, and pelvic cul-de-sac.
Transvaginal technique was performed to assess early pregnancy.

[Series 1: us ob transvaginal · 15 of 55 slices shown]
[im 1/55]
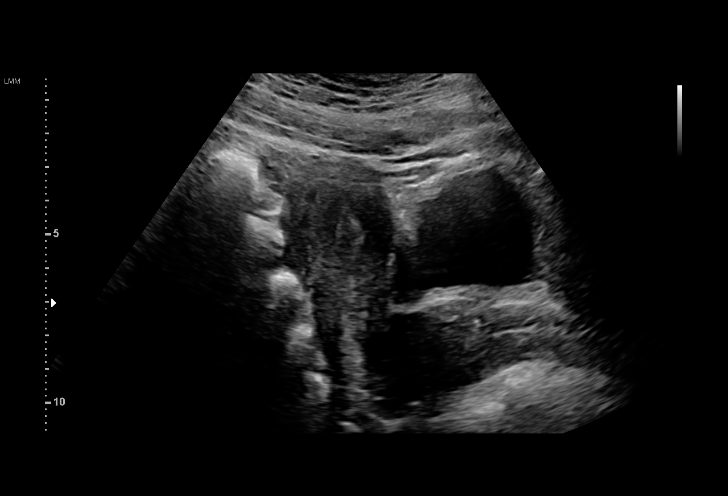
[im 5/55]
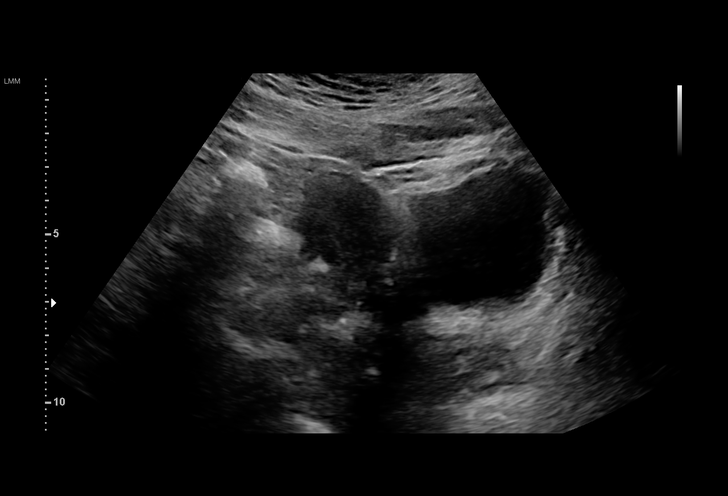
[im 9/55]
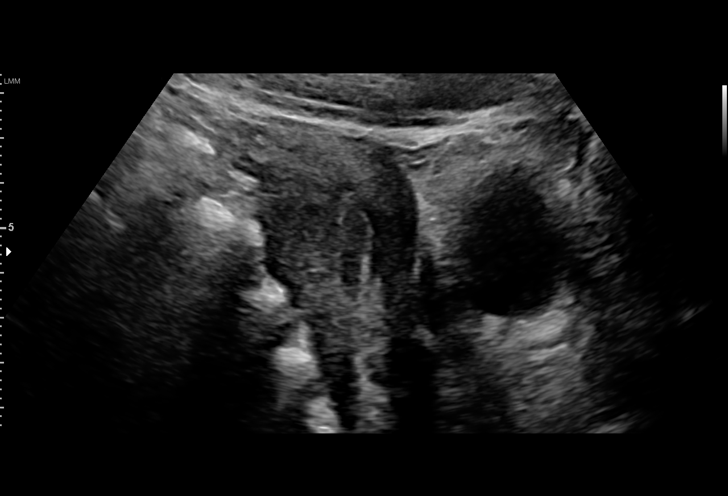
[im 13/55]
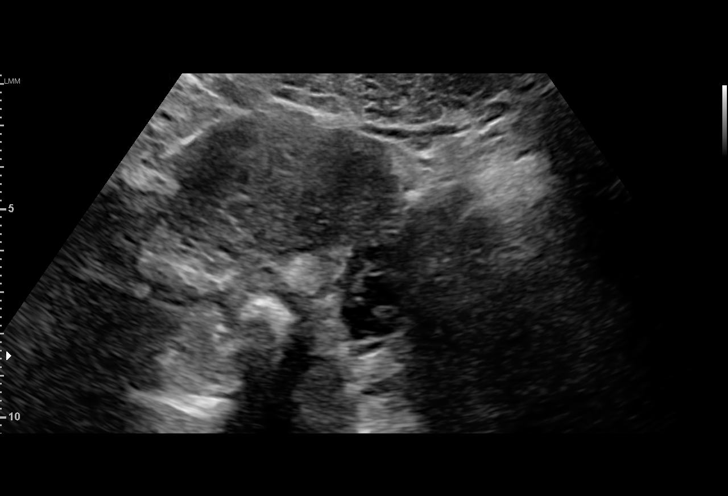
[im 17/55]
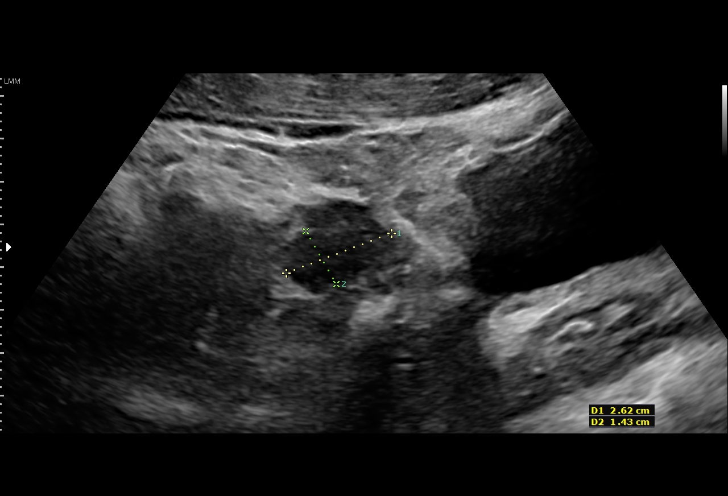
[im 21/55]
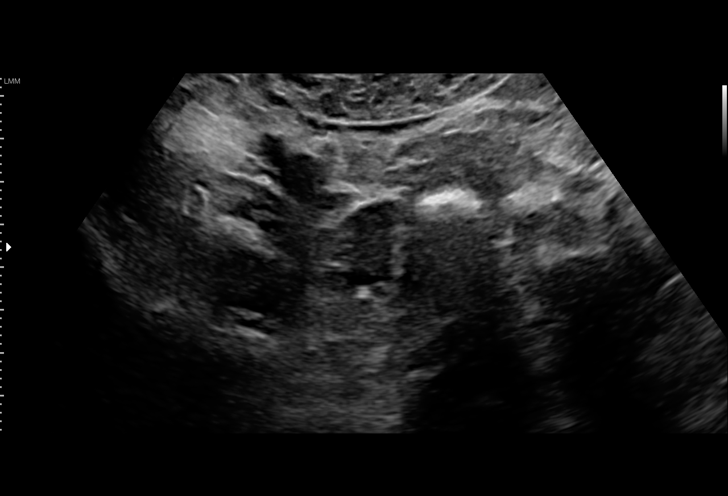
[im 25/55]
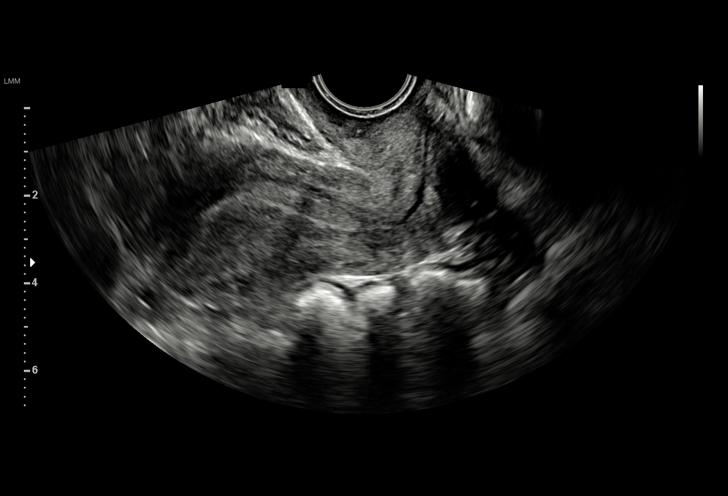
[im 29/55]
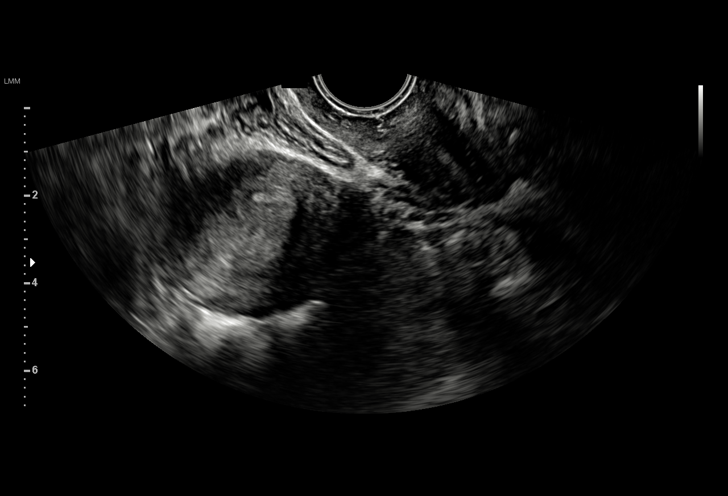
[im 31/55]
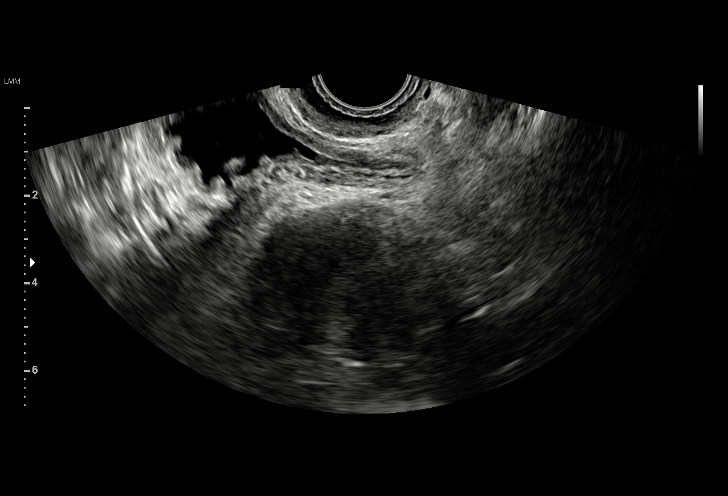
[im 35/55]
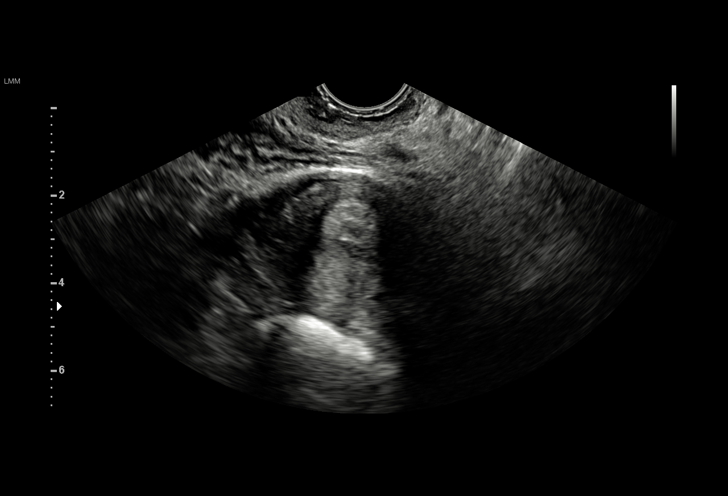
[im 39/55]
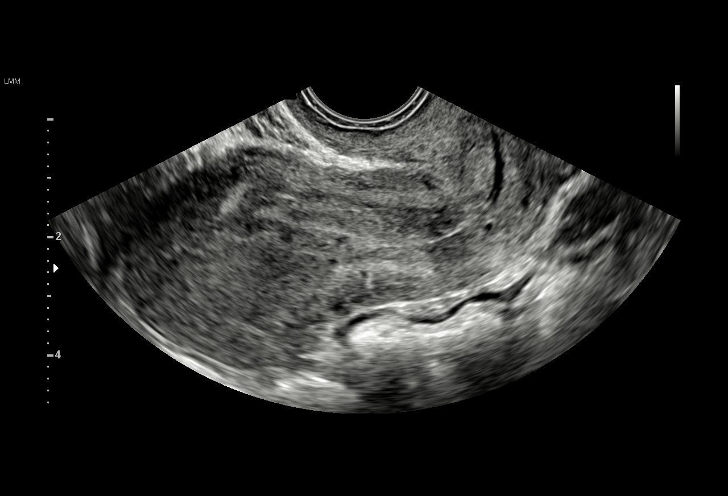
[im 43/55]
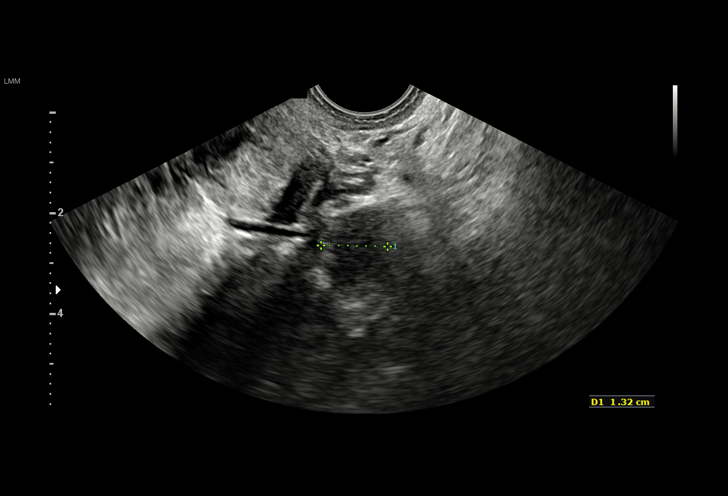
[im 47/55]
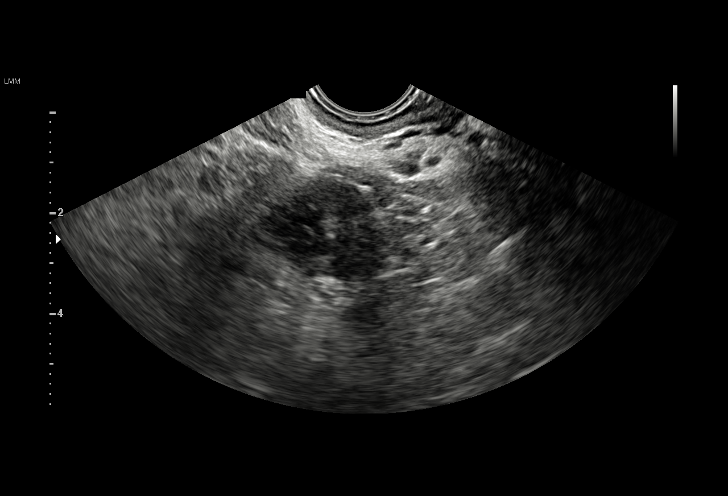
[im 51/55]
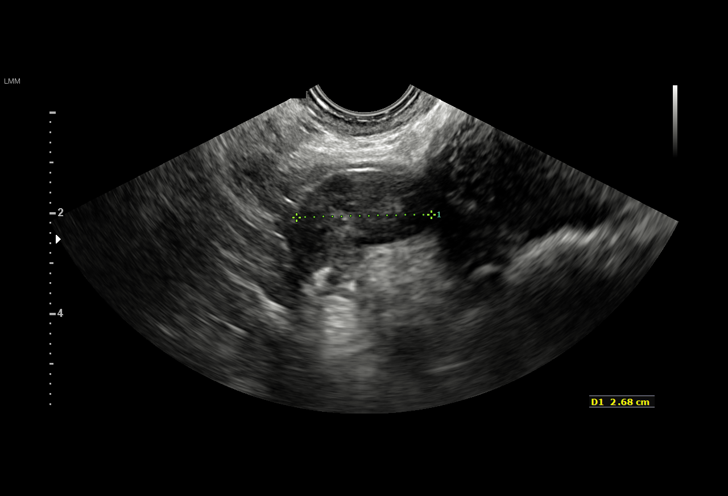
[im 55/55]
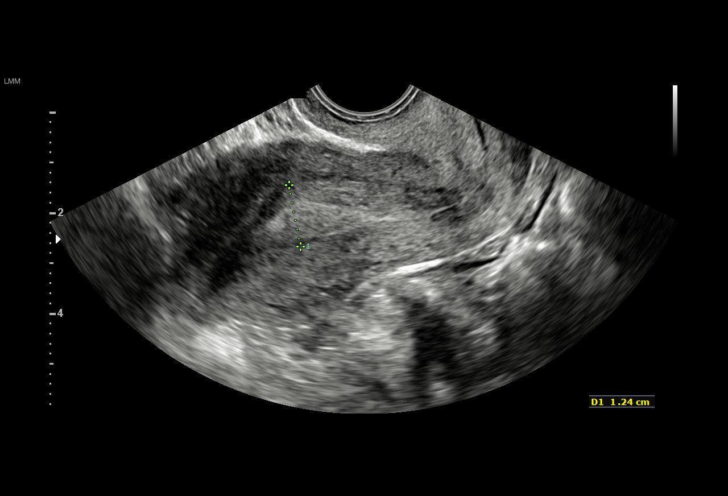

[15 of 28 positions shown; findings below may reference images not displayed]

FINDINGS: Intrauterine gestational sac: Not visualized

Yolk sac:  Not visualized

Embryo:  Not visualized

Cardiac Activity: Not visualized

Subchorionic hemorrhage:  None visualized.

Maternal uterus/adnexae: Uterus is anteverted. Uterus measures 5.7 x
5.4 x 3.7 cm. Endometrium measures 9 mm in thickness with a smooth
contour. Right ovary measures 2.6 x 1.4 x 1.7 cm. Left ovary
measures 2.3 x 2.0 x 2.7 cm. There is no extrauterine pelvic or
adnexal mass. No free pelvic fluid.
IMPRESSION: Study within normal limits. No intrauterine gestation seen. Given
positive pregnancy test, differential considerations must include
intrauterine gestation too early to be seen by either transabdominal
or transvaginal technique; recent spontaneous abortion ; or possible
ectopic gestation. Given this circumstance, close clinical and
laboratory correlation advised. Timing of repeat ultrasound will in
part depend on serial beta HCG values going forward.

## 2017-04-11 ENCOUNTER — Emergency Department (HOSPITAL_BASED_OUTPATIENT_CLINIC_OR_DEPARTMENT_OTHER)
Admission: EM | Admit: 2017-04-11 | Discharge: 2017-04-11 | Disposition: A | Discharge status: Home / Self Care | Payer: Medicaid Other | Attending: Emergency Medicine | Admitting: Emergency Medicine

## 2017-04-11 ENCOUNTER — Encounter (HOSPITAL_BASED_OUTPATIENT_CLINIC_OR_DEPARTMENT_OTHER): Payer: Self-pay

## 2017-04-11 ENCOUNTER — Other Ambulatory Visit: Payer: Self-pay

## 2017-04-11 DIAGNOSIS — K625 Hemorrhage of anus and rectum: Secondary | ICD-10-CM | POA: Diagnosis present

## 2017-04-11 DIAGNOSIS — K649 Unspecified hemorrhoids: Secondary | ICD-10-CM | POA: Diagnosis not present

## 2017-04-11 DIAGNOSIS — K602 Anal fissure, unspecified: Secondary | ICD-10-CM | POA: Diagnosis not present

## 2017-04-11 LAB — CBC WITH DIFFERENTIAL/PLATELET
BASOS ABS: 0 10*3/uL (ref 0.0–0.1)
Basophils Relative: 0 %
EOS ABS: 0.2 10*3/uL (ref 0.0–0.7)
EOS PCT: 2 %
HCT: 40.9 % (ref 36.0–46.0)
Hemoglobin: 13 g/dL (ref 12.0–15.0)
LYMPHS ABS: 2.5 10*3/uL (ref 0.7–4.0)
Lymphocytes Relative: 37 %
MCH: 27.2 pg (ref 26.0–34.0)
MCHC: 31.8 g/dL (ref 30.0–36.0)
MCV: 85.6 fL (ref 78.0–100.0)
Monocytes Absolute: 0.5 10*3/uL (ref 0.1–1.0)
Monocytes Relative: 7 %
Neutro Abs: 3.5 10*3/uL (ref 1.7–7.7)
Neutrophils Relative %: 54 %
PLATELETS: 237 10*3/uL (ref 150–400)
RBC: 4.78 MIL/uL (ref 3.87–5.11)
RDW: 15.1 % (ref 11.5–15.5)
WBC: 6.7 10*3/uL (ref 4.0–10.5)

## 2017-04-11 LAB — COMPREHENSIVE METABOLIC PANEL
ALT: 31 U/L (ref 14–54)
ANION GAP: 9 (ref 5–15)
AST: 25 U/L (ref 15–41)
Albumin: 4 g/dL (ref 3.5–5.0)
Alkaline Phosphatase: 44 U/L (ref 38–126)
BUN: 12 mg/dL (ref 6–20)
CO2: 23 mmol/L (ref 22–32)
Calcium: 8.8 mg/dL — ABNORMAL LOW (ref 8.9–10.3)
Chloride: 108 mmol/L (ref 101–111)
Creatinine, Ser: 0.65 mg/dL (ref 0.44–1.00)
GFR calc Af Amer: >60 mL/min (ref >60)
Glucose, Bld: 87 mg/dL (ref 65–99)
POTASSIUM: 3.6 mmol/L (ref 3.5–5.1)
Sodium: 140 mmol/L (ref 135–145)
Total Bilirubin: 0.3 mg/dL (ref 0.3–1.2)
Total Protein: 7.4 g/dL (ref 6.5–8.1)

## 2017-04-11 LAB — URINALYSIS, ROUTINE W REFLEX MICROSCOPIC
BILIRUBIN URINE: NEGATIVE
Glucose, UA: NEGATIVE mg/dL
KETONES UR: NEGATIVE mg/dL
Leukocytes, UA: NEGATIVE
Nitrite: NEGATIVE
PH: 6.5 (ref 5.0–8.0)
Protein, ur: NEGATIVE mg/dL
SPECIFIC GRAVITY, URINE: 1.02 (ref 1.005–1.030)

## 2017-04-11 LAB — URINALYSIS, MICROSCOPIC (REFLEX): WBC UA: NONE SEEN WBC/hpf (ref 0–5)

## 2017-04-11 LAB — LIPASE, BLOOD: LIPASE: 26 U/L (ref 11–51)

## 2017-04-11 LAB — OCCULT BLOOD X 1 CARD TO LAB, STOOL: FECAL OCCULT BLD: NEGATIVE

## 2017-04-11 LAB — PREGNANCY, URINE: PREG TEST UR: NEGATIVE

## 2017-04-11 MED ORDER — HYDROCORTISONE 2.5 % RE CREA: TOPICAL_CREAM | RECTAL | 0 refills | Status: DC

## 2017-04-11 NOTE — Discharge Instructions (Signed)
As we discussed, increase fiber in her diet to help with constipation.  He can use some over-the-counter laxatives to help.  As we discussed, you need to follow-up with referred to gastroenterology doctor for further evaluation of your symptoms.  Return the emergency department for any worsening pain, bleeding, dizziness, lightheadedness, chest pain, difficulty breathing or any other worsening or concerning symptoms.

## 2017-04-11 NOTE — ED Notes (Signed)
Pt verbalizes understanding of d/c instructions and denies any further needs at this time. 

## 2017-04-11 NOTE — ED Provider Notes (Signed)
[facility] EMERGENCY DEPARTMENT Provider Note   CSN: [id number] Arrival date & time: 04/11/17  1218     History   Chief Complaint Chief Complaint  Patient presents with  . Rectal Bleeding    HPI Christina Freeman is a 22 y.o. female with PMH/o Anxiety, Depression who presents for evaluation of rectal bleeding that has been ongoing for the last year.  Patient reports that she she initially attributed to the rectal bleeding to the fact that she was pregnant and was having hemorrhoids.  Patient reports that she would notice blood on the stool.  She states that she gave birth in November 2018 and has still had rectal bleeding since then.  Patient reports that there is no changes in symptoms that brought her into the ED today but states that she was tired of dealing with the symptoms and did not have anybody to follow-up with since she no longer sees her OB/GYN so she came to the emergency department.  She states that she will intermittently become constipated and states that she does not have bowel movements more than 3 times a week.  She states that when she does have bowel movements, she has to strain.  She states that she has been having blood on the stool and in the toilet.  She denies any melena.  Patient also reports some pain and irritation with bowel movements.  Patient states that this is caused her to have decreased appetite because she is afraid of going to the bathroom.  Patient reports some intermittent dysuria and abdominal cramping that has been ongoing for last several weeks.  No hematuria, fevers, chest pain, difficulty breathing, vomiting.  The history is provided by the patient.    Past Medical History:  Diagnosis Date  . Anxiety   . Depression   . History of posttraumatic stress disorder (PTSD)   . History of self-harm    cutting  last time approx March 2018  . Hx of acute pyelonephritis   . Pregnancy induced hypertension     Patient Active Problem List   Diagnosis Date Noted  . Normal labor and delivery 01/10/2017  . Elevated blood pressure affecting pregnancy, antepartum 01/03/2017  . History of depression 12/11/2016  . Acute bronchitis due to infection 04/20/2015  . Routine health maintenance 04/08/2011    Past Surgical History:  Procedure Laterality Date  . CESAREAN SECTION N/A 01/10/2017   Procedure: CESAREAN SECTION;  Surgeon: Ranae Pila, MD;  Location: [facility] BIRTHING SUITES;  Service: Obstetrics;  Laterality: N/A;  . NO PAST SURGERIES    . tubes in ears      OB History    Gravida Para Term Preterm AB Living   2 1 1  0 1 1   SAB TAB Ectopic Multiple Live Births   1 0 0 0 1       Home Medications    Prior to Admission medications   Medication Sig Start Date End Date Taking? Authorizing Provider  hydrocortisone (ANUSOL-HC) 2.5 % rectal cream Apply rectally 2 times daily 04/11/17   Maxwell Caul, PA-C    Family History Family History  Problem Relation Age of Onset  . Diabetes Father   . Hypertension Father     Social History Social History   Tobacco Use  . Smoking status: Never Smoker  . Smokeless tobacco: Never Used  Substance Use Topics  . Alcohol use: Yes    Comment: occ  . Drug use: Yes    Types: Marijuana  Allergies   Patient has no known allergies.   Review of Systems Review of Systems  Constitutional: Positive for appetite change. Negative for chills and fever.  HENT: Negative for congestion.   Eyes: Negative for visual disturbance.  Respiratory: Negative for cough and shortness of breath.   Cardiovascular: Negative for chest pain.  Gastrointestinal: Positive for abdominal pain and blood in stool. Negative for diarrhea, nausea and vomiting.  Genitourinary: Positive for dysuria. Negative for hematuria.  Musculoskeletal: Negative for back pain and neck pain.  Skin: Negative for rash.  Neurological: Negative for dizziness, weakness, numbness and headaches.    Psychiatric/Behavioral: Negative for confusion.  All other systems reviewed and are negative.    Physical Exam Updated Vital Signs BP 120/69 (BP Location: Right Arm)   Pulse 65   Temp 99.1 F (37.3 C) (Oral)   Resp 18   Ht 5\' 5"  (1.651 m)   Wt 94.3 kg (208 lb)   LMP 04/11/2017   SpO2 100%   BMI 34.61 kg/m   Physical Exam  Constitutional: She is oriented to person, place, and time. She appears well-developed and well-nourished.  Sitting comfortably on examination table  HENT:  Head: Normocephalic and atraumatic.  Mouth/Throat: Oropharynx is clear and moist and mucous membranes are normal.  Eyes: Conjunctivae, EOM and lids are normal. Pupils are equal, round, and reactive to light.  No pale conjunctival.  Neck: Full passive range of motion without pain.  Cardiovascular: Normal rate, regular rhythm, normal heart sounds and normal pulses. Exam reveals no gallop and no friction rub.  No murmur heard. Pulmonary/Chest: Effort normal and breath sounds normal.  Abdominal: Soft. Normal appearance. There is no tenderness. There is no rigidity and no guarding.  Genitourinary: Rectal exam shows external hemorrhoid.  Genitourinary Comments: The exam was performed with a chaperone present.  A small, nonthrombosed hemorrhoid noted to the 9:00 region.  There appears to be a small anal fissure next to his.  No evidence of fistula, tenderness, mass, fluctuance.  No gross blood noted on rectal exam.  Musculoskeletal: Normal range of motion.  Neurological: She is alert and oriented to person, place, and time.  Skin: Skin is warm and dry. Capillary refill takes less than 2 seconds. No pallor.  Psychiatric: She has a normal mood and affect. Her speech is normal.  Nursing note and vitals reviewed.    ED Treatments / Results  Labs (all labs ordered are listed, but only abnormal results are displayed) Labs Reviewed  URINALYSIS, ROUTINE W REFLEX MICROSCOPIC - Abnormal; Notable for the following  components:      Result Value   Hgb urine dipstick LARGE (*)    All other components within normal limits  COMPREHENSIVE METABOLIC PANEL - Abnormal; Notable for the following components:   Calcium 8.8 (*)    All other components within normal limits  URINALYSIS, MICROSCOPIC (REFLEX) - Abnormal; Notable for the following components:   Bacteria, UA RARE (*)    Squamous Epithelial / LPF 0-5 (*)    All other components within normal limits  PREGNANCY, URINE  CBC WITH DIFFERENTIAL/PLATELET  OCCULT BLOOD X 1 CARD TO LAB, STOOL  LIPASE, BLOOD    EKG  EKG Interpretation None       Radiology No results found.  Procedures Procedures (including critical care time)  Medications Ordered in ED Medications - No data to display   Initial Impression / Assessment and Plan / ED Course  I have reviewed the triage vital signs and the nursing notes.  Pertinent labs & imaging results that were available during my care of the patient were reviewed by me and considered in my medical decision making (see chart for details).     22 year old female who presents with rectal bleeding that has been intermittent for the past year.  Initially attributed to hemorrhoids that she had during pregnancy.  Has not followed up for evaluation of symptoms.  Comes to the ED today because she had not seen anybody for it.  Does report some intermittent constipation and states she only has about 3 bowel movements a week.  Does report straining with bowel movements.  Has had some blood in the stool and on the toilet.  No melena. Patient is afebrile, non-toxic appearing, sitting comfortably on examination table. Vital signs reviewed and stable.  Rectal exam does show a non-thrombosed hemorrhoid to the 9:00 region.  Small anal fissure close by.  Consider hemorrhoid versus GI bleed.  History/physical exam is not concerning for rectal abscess.  Plan for basic lab work, fecal occult blood.  Labs reviewed.  CBC is  unremarkable.  Hemoglobin and hematocrit are stable at 13.0/40.9.  Fecal occult is negative.  UA shows some hemoglobin but then no acute infectious signs.  Patient reports that she started having some vaginal spotting that she believes is the start of her period.  Urine pregnancy is negative.   Discussed results with patient.  Discussed with patient regarding ways to improve bowel habits to help with symptoms.  We will plan to provide outpatient GI referral that she can follow-up with regarding symptoms.  At this time, I do not suspect a GI bleed given reassuring workup, reassuring vitals.  Suspect that symptoms may be due to hemorrhoids.  Patient may have some internal hemorrhoids that also could be explaining symptoms.  I suspect that her symptoms are worsened by constipation as she does not have more than 3 bowel movements a week.  Vital signs are stable.  Patient is stable for discharge at this time. Patient had ample opportunity for questions and discussion. All patient's questions were answered with full understanding. Strict return precautions discussed. Patient expresses understanding and agreement to plan.    Final Clinical Impressions(s) / ED Diagnoses   Final diagnoses:  Rectal bleeding  Hemorrhoids, unspecified hemorrhoid type  Anal fissure    ED Discharge Orders        Ordered    hydrocortisone (ANUSOL-HC) 2.5 % rectal cream     04/11/17 1817       Maxwell Caul, PA-C 04/12/17 1647    Tegeler, Canary Brim, MD 04/12/17 [id number]

## 2017-04-11 NOTE — ED Triage Notes (Signed)
C/o rectal bleeding x 2 months-also c/o chills, lower back pain, abd cramping off/on during entire preg-csection Nov 2018-NAD-steady gait

## 2017-09-04 ENCOUNTER — Emergency Department (HOSPITAL_BASED_OUTPATIENT_CLINIC_OR_DEPARTMENT_OTHER): Payer: BLUE CROSS/BLUE SHIELD

## 2017-09-04 ENCOUNTER — Emergency Department (HOSPITAL_BASED_OUTPATIENT_CLINIC_OR_DEPARTMENT_OTHER)
Admission: EM | Admit: 2017-09-04 | Discharge: 2017-09-05 | Disposition: A | Discharge status: Other Acute Inpatient Hospital | Payer: BLUE CROSS/BLUE SHIELD | Attending: Emergency Medicine | Admitting: Emergency Medicine

## 2017-09-04 ENCOUNTER — Other Ambulatory Visit: Payer: Self-pay

## 2017-09-04 ENCOUNTER — Encounter (HOSPITAL_BASED_OUTPATIENT_CLINIC_OR_DEPARTMENT_OTHER): Payer: Self-pay | Admitting: Emergency Medicine

## 2017-09-04 DIAGNOSIS — Y939 Activity, unspecified: Secondary | ICD-10-CM | POA: Diagnosis not present

## 2017-09-04 DIAGNOSIS — S51812A Laceration without foreign body of left forearm, initial encounter: Secondary | ICD-10-CM | POA: Insufficient documentation

## 2017-09-04 DIAGNOSIS — R4588 Nonsuicidal self-harm: Secondary | ICD-10-CM

## 2017-09-04 DIAGNOSIS — Y999 Unspecified external cause status: Secondary | ICD-10-CM | POA: Insufficient documentation

## 2017-09-04 DIAGNOSIS — Y929 Unspecified place or not applicable: Secondary | ICD-10-CM | POA: Insufficient documentation

## 2017-09-04 DIAGNOSIS — S59912A Unspecified injury of left forearm, initial encounter: Secondary | ICD-10-CM | POA: Diagnosis present

## 2017-09-04 DIAGNOSIS — W260XXA Contact with knife, initial encounter: Secondary | ICD-10-CM | POA: Diagnosis not present

## 2017-09-04 DIAGNOSIS — Z79899 Other long term (current) drug therapy: Secondary | ICD-10-CM | POA: Diagnosis not present

## 2017-09-04 DIAGNOSIS — F332 Major depressive disorder, recurrent severe without psychotic features: Secondary | ICD-10-CM | POA: Diagnosis not present

## 2017-09-04 DIAGNOSIS — S41112A Laceration without foreign body of left upper arm, initial encounter: Secondary | ICD-10-CM

## 2017-09-04 DIAGNOSIS — R4589 Other symptoms and signs involving emotional state: Secondary | ICD-10-CM

## 2017-09-04 LAB — CBC WITH DIFFERENTIAL/PLATELET
BASOS PCT: 0 %
Basophils Absolute: 0 10*3/uL (ref 0.0–0.1)
Eosinophils Absolute: 0.2 10*3/uL (ref 0.0–0.7)
Eosinophils Relative: 1 %
HEMATOCRIT: 45.4 % (ref 36.0–46.0)
HEMOGLOBIN: 14.8 g/dL (ref 12.0–15.0)
LYMPHS PCT: 20 %
Lymphs Abs: 2.4 10*3/uL (ref 0.7–4.0)
MCH: 28.6 pg (ref 26.0–34.0)
MCHC: 32.6 g/dL (ref 30.0–36.0)
MCV: 87.6 fL (ref 78.0–100.0)
MONOS PCT: 6 %
Monocytes Absolute: 0.8 10*3/uL (ref 0.1–1.0)
NEUTROS ABS: 8.7 10*3/uL — AB (ref 1.7–7.7)
Neutrophils Relative %: 73 %
Platelets: 240 10*3/uL (ref 150–400)
RBC: 5.18 MIL/uL — ABNORMAL HIGH (ref 3.87–5.11)
RDW: 14.7 % (ref 11.5–15.5)
WBC: 12.1 10*3/uL — ABNORMAL HIGH (ref 4.0–10.5)

## 2017-09-04 LAB — COMPREHENSIVE METABOLIC PANEL
ALBUMIN: 4.3 g/dL (ref 3.5–5.0)
ALK PHOS: 57 U/L (ref 38–126)
ALT: 13 U/L (ref 0–44)
ANION GAP: 10 (ref 5–15)
AST: 16 U/L (ref 15–41)
BUN: 13 mg/dL (ref 6–20)
CALCIUM: 8.8 mg/dL — AB (ref 8.9–10.3)
CO2: 20 mmol/L — AB (ref 22–32)
Chloride: 108 mmol/L (ref 98–111)
Creatinine, Ser: 0.81 mg/dL (ref 0.44–1.00)
GFR calc non Af Amer: >60 mL/min (ref >60)
Glucose, Bld: 89 mg/dL (ref 70–99)
POTASSIUM: 3.8 mmol/L (ref 3.5–5.1)
SODIUM: 138 mmol/L (ref 135–145)
Total Bilirubin: 0.6 mg/dL (ref 0.3–1.2)
Total Protein: 7.9 g/dL (ref 6.5–8.1)

## 2017-09-04 LAB — PREGNANCY, URINE: PREG TEST UR: NEGATIVE

## 2017-09-04 LAB — URINALYSIS, ROUTINE W REFLEX MICROSCOPIC
Bilirubin Urine: NEGATIVE
Glucose, UA: NEGATIVE mg/dL
Hgb urine dipstick: NEGATIVE
Ketones, ur: NEGATIVE mg/dL
Leukocytes, UA: NEGATIVE
Nitrite: NEGATIVE
PROTEIN: NEGATIVE mg/dL
Specific Gravity, Urine: >1.03 — ABNORMAL HIGH (ref 1.005–1.030)
pH: 5.5 (ref 5.0–8.0)

## 2017-09-04 LAB — RAPID URINE DRUG SCREEN, HOSP PERFORMED
Amphetamines: POSITIVE — AB
BENZODIAZEPINES: POSITIVE — AB
Cocaine: POSITIVE — AB
OPIATES: NOT DETECTED
Tetrahydrocannabinol: POSITIVE — AB

## 2017-09-04 LAB — ACETAMINOPHEN LEVEL: Acetaminophen (Tylenol), Serum: <10 ug/mL — ABNORMAL LOW (ref 10–30)

## 2017-09-04 LAB — SALICYLATE LEVEL

## 2017-09-04 MED ORDER — LIDOCAINE-EPINEPHRINE (PF) 2 %-1:200000 IJ SOLN
INTRAMUSCULAR | Status: AC
  Administered 2017-09-04: 10 mL via INTRADERMAL
  Filled 2017-09-04: qty 10

## 2017-09-04 MED ORDER — LIDOCAINE-EPINEPHRINE (PF) 2 %-1:200000 IJ SOLN
20.0000 mL | Freq: Once | INTRAMUSCULAR | Status: AC
  Administered 2017-09-04: 10 mL via INTRADERMAL
  Filled 2017-09-04: qty 20

## 2017-09-04 MED ORDER — HYDROCODONE-ACETAMINOPHEN 5-325 MG PO TABS
1.0000 | ORAL_TABLET | Freq: Once | ORAL | Status: AC
  Administered 2017-09-04: 1 via ORAL
  Filled 2017-09-04: qty 1
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
## 2017-09-04 NOTE — ED Notes (Signed)
No changes. Alert, NAD, calm, cooperative, pleasant. Denies SI/HI or desire for self harm. Pt in paper scrubs and no slip socks. Wanded by security. Denies physical needs. Denies: pain, HA, nausea, hunger thirst or other sx. Steady gait back from b/r. Urine sample sent. TTS started.

## 2017-09-04 NOTE — ED Notes (Addendum)
Pt currently being sutured. TTS initiated. [facility]([place] ready for TTS by video screen. Will contact BH when suturing complete. Family x2 outside of room.

## 2017-09-04 NOTE — ED Notes (Signed)
EDPA in to speak with update pt.   [facility] Monday[facility] To be admitted inpt, voluntary, for depression.  Staffing contacted for sitter.

## 2017-09-04 NOTE — BH Assessment (Signed)
[facility] and can arrive after 0900.  RN Christiane HaJonathan and PA Elson ClanLayden were made aware of the recommendation.  The pt's MD will be Dr. Jama Flavorsobos.

## 2017-09-04 NOTE — ED Notes (Signed)
Pt given Lavender Essential Oil on a cotton ball to inhale and also placed on cotton ball in the room per policy.

## 2017-09-04 NOTE — ED Notes (Addendum)
[facility] updated. Alert, NAD, calm, interactive, resps e/u, no dyspnea noted. CMS, ROM intact. Flexion/extension strength equal and strong. Older horizontal scars noted. Positive Allens test.

## 2017-09-04 NOTE — ED Notes (Signed)
TTS complete. Recommend inpt. EDPA/EDP aware.

## 2017-09-04 NOTE — ED Notes (Signed)
X-Ray at bedside.
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
## 2017-09-04 NOTE — ED Notes (Signed)
No changes. Sitting upright in stretcher. [facility] alert, NAD, calm, cooperative. Room secured. Door open. Visualized by from nurses's station. Security and [employer] rounding. Pending [facility] TTS notes.

## 2017-09-04 NOTE — ED Notes (Addendum)
No changes. Calm, tearful, cooperative, pleasant. Denies needs. Eating/ reclining in stretcher. [facility] Updated on timeframe/ plan.
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
## 2017-09-05 ENCOUNTER — Other Ambulatory Visit: Payer: Self-pay

## 2017-09-05 ENCOUNTER — Inpatient Hospital Stay (HOSPITAL_COMMUNITY)
Admission: AD | Admit: 2017-09-05 | Discharge: 2017-09-08 | DRG: 881 | Disposition: A | Discharge status: Home / Self Care | Payer: BLUE CROSS/BLUE SHIELD | Source: Intra-hospital | Attending: Psychiatry | Admitting: Psychiatry

## 2017-09-05 ENCOUNTER — Encounter (HOSPITAL_COMMUNITY): Payer: Self-pay | Admitting: *Deleted

## 2017-09-05 DIAGNOSIS — F1490 Cocaine use, unspecified, uncomplicated: Secondary | ICD-10-CM | POA: Diagnosis not present

## 2017-09-05 DIAGNOSIS — G4700 Insomnia, unspecified: Secondary | ICD-10-CM

## 2017-09-05 DIAGNOSIS — Z87891 Personal history of nicotine dependence: Secondary | ICD-10-CM | POA: Diagnosis not present

## 2017-09-05 DIAGNOSIS — F1290 Cannabis use, unspecified, uncomplicated: Secondary | ICD-10-CM | POA: Diagnosis not present

## 2017-09-05 DIAGNOSIS — Z62810 Personal history of physical and sexual abuse in childhood: Secondary | ICD-10-CM

## 2017-09-05 DIAGNOSIS — F17210 Nicotine dependence, cigarettes, uncomplicated: Secondary | ICD-10-CM | POA: Diagnosis present

## 2017-09-05 DIAGNOSIS — F1210 Cannabis abuse, uncomplicated: Secondary | ICD-10-CM | POA: Diagnosis present

## 2017-09-05 DIAGNOSIS — Z8249 Family history of ischemic heart disease and other diseases of the circulatory system: Secondary | ICD-10-CM | POA: Diagnosis not present

## 2017-09-05 DIAGNOSIS — F329 Major depressive disorder, single episode, unspecified: Secondary | ICD-10-CM | POA: Diagnosis present

## 2017-09-05 DIAGNOSIS — IMO0002 Reserved for concepts with insufficient information to code with codable children: Secondary | ICD-10-CM

## 2017-09-05 DIAGNOSIS — Z7289 Other problems related to lifestyle: Secondary | ICD-10-CM

## 2017-09-05 DIAGNOSIS — F332 Major depressive disorder, recurrent severe without psychotic features: Secondary | ICD-10-CM | POA: Diagnosis not present

## 2017-09-05 DIAGNOSIS — S41112A Laceration without foreign body of left upper arm, initial encounter: Secondary | ICD-10-CM | POA: Diagnosis present

## 2017-09-05 DIAGNOSIS — F419 Anxiety disorder, unspecified: Secondary | ICD-10-CM | POA: Diagnosis not present

## 2017-09-05 DIAGNOSIS — F4310 Post-traumatic stress disorder, unspecified: Secondary | ICD-10-CM

## 2017-09-05 DIAGNOSIS — X789XXA Intentional self-harm by unspecified sharp object, initial encounter: Secondary | ICD-10-CM | POA: Diagnosis present

## 2017-09-05 DIAGNOSIS — Z630 Problems in relationship with spouse or partner: Secondary | ICD-10-CM | POA: Diagnosis not present

## 2017-09-05 DIAGNOSIS — Z915 Personal history of self-harm: Secondary | ICD-10-CM | POA: Diagnosis not present

## 2017-09-05 DIAGNOSIS — Z833 Family history of diabetes mellitus: Secondary | ICD-10-CM | POA: Diagnosis not present

## 2017-09-05 DIAGNOSIS — Z560 Unemployment, unspecified: Secondary | ICD-10-CM | POA: Diagnosis not present

## 2017-09-05 DIAGNOSIS — F1990 Other psychoactive substance use, unspecified, uncomplicated: Secondary | ICD-10-CM | POA: Diagnosis not present

## 2017-09-05 LAB — PREGNANCY, URINE: Preg Test, Ur: NEGATIVE

## 2017-09-05 MED ORDER — ACETAMINOPHEN 325 MG PO TABS
650.0000 mg | ORAL_TABLET | Freq: Four times a day (QID) | ORAL | Status: DC | PRN
  Administered 2017-09-05: 650 mg via ORAL
  Filled 2017-09-05: qty 2

## 2017-09-05 MED ORDER — TRAZODONE HCL 50 MG PO TABS
50.0000 mg | ORAL_TABLET | Freq: Every evening | ORAL | Status: DC | PRN
  Administered 2017-09-06 – 2017-09-07 (×2): 50 mg via ORAL
  Filled 2017-09-05 (×3): qty 1

## 2017-09-05 MED ORDER — DULOXETINE HCL 30 MG PO CPEP
30.0000 mg | ORAL_CAPSULE | Freq: Every day | ORAL | Status: DC
  Administered 2017-09-05: 30 mg via ORAL
  Filled 2017-09-05 (×4): qty 1

## 2017-09-05 MED ORDER — HYDROXYZINE HCL 25 MG PO TABS
25.0000 mg | ORAL_TABLET | Freq: Three times a day (TID) | ORAL | Status: DC | PRN
  Administered 2017-09-05 – 2017-09-07 (×2): 25 mg via ORAL
  Filled 2017-09-05 (×2): qty 1

## 2017-09-05 MED ORDER — DULOXETINE HCL 20 MG PO CPEP
40.0000 mg | ORAL_CAPSULE | Freq: Every day | ORAL | Status: DC
  Administered 2017-09-06 – 2017-09-08 (×3): 40 mg via ORAL
  Filled 2017-09-05 (×5): qty 2

## 2017-09-05 MED ORDER — ALUM & MAG HYDROXIDE-SIMETH 200-200-20 MG/5ML PO SUSP: 30.0000 mL | ORAL | Status: DC | PRN

## 2017-09-05 MED ORDER — MUPIROCIN CALCIUM 2 % EX CREA
TOPICAL_CREAM | Freq: Two times a day (BID) | CUTANEOUS | Status: DC
  Administered 2017-09-05 – 2017-09-08 (×6): via TOPICAL
  Filled 2017-09-05 (×2): qty 15

## 2017-09-05 MED ORDER — MAGNESIUM HYDROXIDE 400 MG/5ML PO SUSP: 30.0000 mL | Freq: Every day | ORAL | Status: DC | PRN

## 2017-09-05 NOTE — Tx Team (Signed)
Initial Treatment Plan 09/05/2017 12:41 PM Christina Freeman [id number]    PATIENT STRESSORS: Health problems Marital or family conflict Substance abuse   PATIENT STRENGTHS: Ability for insight Average or above average intelligence Communication skills Financial means General fund of knowledge Motivation for treatment/growth Supportive family/friends Work skills   PATIENT IDENTIFIED PROBLEMS: "depression"  "anxiety"  "panic"  "suicide thoughts"               DISCHARGE CRITERIA:  Ability to meet basic life and health needs Adequate post-discharge living arrangements Improved stabilization in mood, thinking, and/or behavior Medical problems require only outpatient monitoring Motivation to continue treatment in a less acute level of care Need for constant or close observation no longer present Reduction of life-threatening or endangering symptoms to within safe limits Safe-care adequate arrangements made Verbal commitment to aftercare and medication compliance Withdrawal symptoms are absent or subacute and managed without 24-hour nursing intervention  PRELIMINARY DISCHARGE PLAN: Attend aftercare/continuing care group Attend PHP/IOP Attend 12-step recovery group Outpatient therapy Participate in family therapy Return to previous living arrangement  PATIENT/FAMILY INVOLVEMENT: This treatment plan has been presented to and reviewed with the patient, Christina Freeman.  The patient and family have been given the opportunity to ask questions and make suggestions.  Quintella ReichertKnight, Kharlie Bring Mid[place], [place]RN 09/05/2017, 12:41 PM
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
## 2017-09-05 NOTE — [facility] Group Notes (Signed)
LCSW Group Therapy Note   09/05/2017 1:15pm   Type of Therapy and Topic:  Group Therapy:  Overcoming Obstacles   Participation Level:  Active   Description of Group:    In this group patients will be encouraged to explore what they see as obstacles to their own wellness and recovery. They will be guided to discuss their thoughts, feelings, and behaviors related to these obstacles. The group will process together ways to cope with barriers, with attention given to specific choices patients can make. Each patient will be challenged to identify changes they are motivated to make in order to overcome their obstacles. This group will be process-oriented, with patients participating in exploration of their own experiences as well as giving and receiving support and challenge from other group members.   Therapeutic Goals: 1. Patient will identify personal and current obstacles as they relate to admission. 2. Patient will identify barriers that currently interfere with their wellness or overcoming obstacles.  3. Patient will identify feelings, thought process and behaviors related to these barriers. 4. Patient will identify two changes they are willing to make to overcome these obstacles:      Summary of Patient Progress  Lequita HaltMorgan was attentive and engaged during today's processing group. She shared that her biggest obstacle is "managing depression and anger." Lequita HaltMorgan plans to return home to her boyfriend and 1yo child at discharge and is interested in outpatient medication management and therapy. She continues to show progress in the group setting with improving insight.     Therapeutic Modalities:   Cognitive Behavioral Therapy Solution Focused Therapy Motivational Interviewing Relapse Prevention Therapy  Rona RavensHeather S Senai Kingsley, LCSW 09/05/2017 1:09 PM

## 2017-09-05 NOTE — Progress Notes (Addendum)
[facility] Bed [facility] Donell SievertSpencer Simon, PA is the accepting provider.  Dr. Nehemiah MassedFernando Cobos is the attending provider.  Call report to [phone]  [email or web address]  ED notified  Pt is Voluntary.  Pt may be transported by [facility] scheduled  [facility]  Carney BernJean T. Kaylyn LimSutter, MSW, LCSWA Disposition Clinical Social Work [phone] (cell) [phone] (office)
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
## 2017-09-05 NOTE — ED Notes (Addendum)
Up to b/r. Pt removed L FA dressing. Sutured wound began bleeding with arm use. Wound remains approximated well. Sutures intact. Wound cleaned. Bacitracin & telfa applied with ACE wrap. States, "feels better". Given water per request. Denies other needs or desires. Returned to sleep. [facility]

## 2017-09-05 NOTE — ED Notes (Signed)
Pt leaving at this time with [facility].

## 2017-09-05 NOTE — Plan of Care (Signed)
Nurse discussed anxiety;, depression, suicide thoughts, coping skills with patient.

## 2017-09-05 NOTE — Plan of Care (Signed)
Nurse discussed anxiety, depression, coping skills with patient. 
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
## 2017-09-06 LAB — TSH: TSH: 1.594 u[IU]/mL (ref 0.350–4.500)

## 2017-09-06 NOTE — Progress Notes (Signed)
Writer med with pt at the beginning of the shift.  She was in her room getting ready to go to the dayroom to wait for the evening group.  Pt reports she got here earlier today.  She denies SI/HI/AVH.  She denies having any withdrawal symptoms at this time.  She voiced no needs or concerns.  Writer informed pt that she had a sleep aid available if she needed it.  She showed writer the laceration with stitches to her L forearm.  It looks clean and dry.  No redness or s/s of infection at this time.  She was told to inform staff if the wound changes or had signs of infection.  Pt voiced understanding.  Support and encouragement offered.  Discharge plans are in process.  Safety maintained with q15 minute checks.
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
## 2017-09-06 NOTE — Progress Notes (Signed)
D: Lequita HaltMorgan was subdued and cooperative this a.m. She denied SI/HI/AVH and contracted for safety. Stiches to her left arm were without pus, drainage, or significant swelling/erythema. Pt complained of menstrual pain but otherwise denied needs or concerns when offered the opportunity. On her self inventory she reported good sleep, fair appetite, normal energy level, and good concentration. She rated her depression 2/10 and anxiety 3/10.   A: Meds given as ordered. Q15 safety checks maintained. Support/encouragement offered.  R: Pt remains free from harm and continues with treatment. Will continue to monitor for needs/safety.

## 2017-09-06 NOTE — Progress Notes (Signed)
Recreation Therapy Notes  Animal-Assisted Activity (AAA) Program Checklist/Progress Notes Patient Eligibility Criteria Checklist & Daily Group note for Rec Tx Intervention  Date: 7.16.19 Time: 1430 Location: [address] Dayroom   AAA/T Program Assumption of Risk Form signed by Patient/ or Parent Legal Guardian YES   Patient is free of allergies or sever asthma YES   Patient reports no fear of animals YES   Patient reports no history of cruelty to animals YES   Patient understands his/her participation is voluntary YES   Patient washes hands before animal contact  YES  Patient washes hands after animal contact  YES   Education: Hand Washing, Appropriate Animal Interaction   Education Outcome: Acknowledges understanding/In group clarification offered/Needs additional education.   Clinical Observations/Feedback: Pt did not attend activity.    Kerstie Agent, LRT/CTRS         Rashan Rounsaville A 09/06/2017 3:16 PM 
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
## 2017-09-06 NOTE — BHH Group Notes (Signed)
[facility] Group Notes:  (Nursing/MHT/Case Management/Adjunct)  Date:  09/06/2017  Time:  1445  Type of Therapy:  Nurse Education  Participation Level:  Active  Participation Quality:  Appropriate  Affect:  Appropriate  Cognitive:  Appropriate  Insight:  Appropriate  Engagement in Group:  Engaged  Modes of Intervention:  Discussion, Education, Socialization and Support  Summary of Progress/Problems: Pt was engaged during relaxation group.   Maurine SimmeringShugart, Tarae Wooden M 09/06/2017, 5:47 PM

## 2017-09-06 NOTE — BHH Suicide Risk Assessment (Signed)
[facility] INPATIENT:  Family/Significant Other Suicide Prevention Education  Suicide Prevention Education:  Contact Attempts: [place] (pt's boyfriend) [phone] has been identified by the patient as the family member/significant other with whom the patient will be residing, and identified as the person(s) who will aid the patient in the event of a mental health crisis.  With written consent from the patient, two attempts were made to provide suicide prevention education, prior to and/or following the patient's discharge.  We were unsuccessful in providing suicide prevention education.  A suicide education pamphlet was given to the patient to share with family/significant other.  Date and time of first attempt: 09/06/17 at 2:45PM (Voicemail left requesting call back at his earliest convenience).  Date and time of second attempt: TO BE DONE.   Rona RavensHeather S Jkai Arwood LCSW 09/06/2017, 2:45 PM

## 2017-09-07 DIAGNOSIS — F419 Anxiety disorder, unspecified: Secondary | ICD-10-CM

## 2017-09-07 DIAGNOSIS — Z87891 Personal history of nicotine dependence: Secondary | ICD-10-CM

## 2017-09-07 DIAGNOSIS — F1290 Cannabis use, unspecified, uncomplicated: Secondary | ICD-10-CM

## 2017-09-07 NOTE — Progress Notes (Signed)
Patient was pleasant upon approach. Patient said she slept well and she  Denies SI HI AVH. Patient voiced no complaints and all questions and concerns were addressed.  Patient compliant with medications prescribed per provider. Safety maintained with 15 minute checks as well as environmental checks. Will continue to monitor.

## 2017-09-07 NOTE — Progress Notes (Signed)
Pt attended NA group this evening.  

## 2017-09-07 NOTE — Plan of Care (Signed)
  Problem: Education: Goal: Knowledge of [facility] [facility] Education information/materials will improve Outcome: Progressing Goal: Emotional status will improve Outcome: Progressing Goal: Mental status will improve Outcome: Progressing Goal: Verbalization of understanding the information provided will improve Outcome: Progressing   Problem: Activity: Goal: Interest or engagement in activities will improve Outcome: Progressing Goal: Sleeping patterns will improve Outcome: Progressing   Problem: Coping: Goal: Ability to verbalize frustrations and anger appropriately will improve Outcome: Progressing Goal: Ability to demonstrate self-control will improve Outcome: Progressing   Problem: Health Behavior/Discharge Planning: Goal: Identification of resources available to assist in meeting health care needs will improve Outcome: Progressing Goal: Compliance with treatment plan for underlying cause of condition will improve Outcome: Progressing   Problem: Physical Regulation: Goal: Ability to maintain clinical measurements within normal limits will improve Outcome: Progressing   Problem: Safety: Goal: Periods of time without injury will increase Outcome: Progressing   Problem: Education: Goal: Ability to state activities that reduce stress will improve Outcome: Progressing   Problem: Coping: Goal: Ability to identify and develop effective coping behavior will improve Outcome: Progressing   Problem: Self-Concept: Goal: Ability to identify factors that promote anxiety will improve Outcome: Progressing Goal: Level of anxiety will decrease Outcome: Progressing Goal: Ability to modify response to factors that promote anxiety will improve Outcome: Progressing   Problem: Education: Goal: Utilization of techniques to improve thought processes will improve Outcome: Progressing Goal: Knowledge of the prescribed therapeutic regimen will improve Outcome: Progressing   Problem:  Activity: Goal: Interest or engagement in leisure activities will improve Outcome: Progressing Goal: Imbalance in normal sleep/wake cycle will improve Outcome: Progressing   Problem: Coping: Goal: Coping ability will improve Outcome: Progressing Goal: Will verbalize feelings Outcome: Progressing   Problem: Health Behavior/Discharge Planning: Goal: Ability to make decisions will improve Outcome: Progressing Goal: Compliance with therapeutic regimen will improve Outcome: Progressing   Problem: Role Relationship: Goal: Will demonstrate positive changes in social behaviors and relationships Outcome: Progressing   Problem: Safety: Goal: Ability to disclose and discuss suicidal ideas will improve Outcome: Progressing Goal: Ability to identify and utilize support systems that promote safety will improve Outcome: Progressing   Problem: Education: Goal: Ability to make informed decisions regarding treatment will improve Outcome: Progressing   Problem: Coping: Goal: Coping ability will improve Outcome: Progressing   Problem: Medication: Goal: Compliance with prescribed medication regimen will improve Outcome: Progressing   Problem: Self-Concept: Goal: Ability to disclose and discuss suicidal ideas will improve Outcome: Progressing Goal: Will verbalize positive feelings about self Outcome: Progressing
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
## 2017-09-07 NOTE — Therapy (Signed)
Occupational Therapy Group Note  Date:  09/07/2017 Time:  12:47 PM  Group Topic/Focus:  Stress Management  Participation Level:  Active  Participation Quality:  Appropriate  Affect:  Appropriate  Cognitive:  Appropriate  Insight: Improving  Engagement in Group:  Distracting and Engaged  Modes of Intervention:  Activity, Discussion, Education and Socialization  Additional Comments:    S: "I listen to different kinds of music depending on my mood"  O: Education given on a variety of stress management coping skills in the rleation of bingo. Coping skills bingo played with education given on variety of coping skills between bingo calls. Pts encouraged to share experience with various coping skills and share what has worked for them with others. Coloring and relaxation guide handouts given at the end of the session.  ?  A: Pt presents to group with appropriate affect, engaged and participatory but at times disruptive with other peers. Pt tending to have side conversations with two other males and loudly laughing and commenting the entire session. Pt explotred various stress management coping skills and engaged in bingo game with much enjoyment. Eager to accept relaxation and coloring handouts at end of session.  ?  P: Pt provided with education on stress management activities to implement into daily routine. Handouts given to facilitate carryover when reintegrating into community.    Christina HandingKaylee Nishanth Freeman, MSOT, OTR/L  ClarktonKaylee Tishawna Freeman 09/07/2017, 12:47 PM

## 2017-09-07 NOTE — Progress Notes (Signed)
Pt reports she is doing better this evening, and she has been out of her room in the dayroom socializing with peers more than the previous evening.  She denies SI/HI/AVH at this time.  She voiced no needs or concerns to Clinical research associatewriter.  At bedtime, she did request a sleep aid, saying that she was feeling somewhat anxious this evening and felt she would have difficulty going to sleep.  Pt was given Trazodone as requested and ordered.  Support and encouragement offered.  Discharge plans are in process.  Pt plans to return home with her boyfriend and her 608 month old child.  Safety maintained with q15 minute checks.

## 2017-09-07 NOTE — Tx Team (Signed)
Interdisciplinary Treatment and Diagnostic Plan Update  09/07/2017 Time of Session: 0830AM Arshiya Jakes MRN: [id number]  Principal Diagnosis: MDD (major depressive disorder)  Secondary Diagnoses: Principal Problem:   MDD (major depressive disorder) Active Problems:   Self-inflicted injury   Current Medications:  Current Facility-Administered Medications  Medication Dose Route Frequency Provider Last Rate Last Dose  . acetaminophen (TYLENOL) tablet 650 mg  650 mg Oral Q6H PRN Oneta Rack, NP   650 mg at 09/05/17 1524  . alum & mag hydroxide-simeth (MAALOX/MYLANTA) 200-200-20 MG/5ML suspension 30 mL  30 mL Oral Q4H PRN Oneta Rack, NP      . DULoxetine (CYMBALTA) DR capsule 40 mg  40 mg Oral Daily Cobos, Rockey Situ, MD   40 mg at 09/07/17 0815  . hydrOXYzine (ATARAX/VISTARIL) tablet 25 mg  25 mg Oral TID PRN Oneta Rack, NP   25 mg at 09/05/17 1526  . magnesium hydroxide (MILK OF MAGNESIA) suspension 30 mL  30 mL Oral Daily PRN Oneta Rack, NP      . mupirocin cream (BACTROBAN) 2 %   Topical BID Oneta Rack, NP      . traZODone (DESYREL) tablet 50 mg  50 mg Oral QHS PRN Oneta Rack, NP   50 mg at 09/06/17 2108   PTA Medications: Medications Prior to Admission  Medication Sig Dispense Refill Last Dose  . DULoxetine (CYMBALTA) 30 MG capsule Take 30 mg by mouth daily.       Patient Stressors: Health problems Marital or family conflict Substance abuse  Patient Strengths: Ability for insight Average or above average intelligence Metallurgist fund of knowledge Motivation for treatment/growth Supportive family/friends Work skills  Treatment Modalities: Medication Management, Group therapy, Case management,  1 to 1 session with clinician, Psychoeducation, Recreational therapy.   Physician Treatment Plan for Primary Diagnosis: MDD (major depressive disorder) Long Term Goal(s): Improvement in symptoms so as ready for  discharge Improvement in symptoms so as ready for discharge   Short Term Goals: Ability to identify changes in lifestyle to reduce recurrence of condition will improve Ability to demonstrate self-control will improve Ability to identify and develop effective coping behaviors will improve Compliance with prescribed medications will improve Ability to identify triggers associated with substance abuse/mental health issues will improve Ability to identify changes in lifestyle to reduce recurrence of condition will improve Ability to identify and develop effective coping behaviors will improve Ability to maintain clinical measurements within normal limits will improve Compliance with prescribed medications will improve  Medication Management: Evaluate patient's response, side effects, and tolerance of medication regimen.  Therapeutic Interventions: 1 to 1 sessions, Unit Group sessions and Medication administration.  Evaluation of Outcomes: Progressing  Physician Treatment Plan for Secondary Diagnosis: Principal Problem:   MDD (major depressive disorder) Active Problems:   Self-inflicted injury  Long Term Goal(s): Improvement in symptoms so as ready for discharge Improvement in symptoms so as ready for discharge   Short Term Goals: Ability to identify changes in lifestyle to reduce recurrence of condition will improve Ability to demonstrate self-control will improve Ability to identify and develop effective coping behaviors will improve Compliance with prescribed medications will improve Ability to identify triggers associated with substance abuse/mental health issues will improve Ability to identify changes in lifestyle to reduce recurrence of condition will improve Ability to identify and develop effective coping behaviors will improve Ability to maintain clinical measurements within normal limits will improve Compliance with prescribed medications will improve  Medication  Management: Evaluate patient's response, side effects, and tolerance of medication regimen.  Therapeutic Interventions: 1 to 1 sessions, Unit Group sessions and Medication administration.  Evaluation of Outcomes: Progressing   RN Treatment Plan for Primary Diagnosis: MDD (major depressive disorder) Long Term Goal(s): Knowledge of disease and therapeutic regimen to maintain health will improve  Short Term Goals: Ability to remain free from injury will improve, Ability to disclose and discuss suicidal ideas and Ability to identify and develop effective coping behaviors will improve  Medication Management: RN will administer medications as ordered by provider, will assess and evaluate patient's response and provide education to patient for prescribed medication. RN will report any adverse and/or side effects to prescribing provider.  Therapeutic Interventions: 1 on 1 counseling sessions, Psychoeducation, Medication administration, Evaluate responses to treatment, Monitor vital signs and CBGs as ordered, Perform/monitor CIWA, COWS, AIMS and Fall Risk screenings as ordered, Perform wound care treatments as ordered.  Evaluation of Outcomes: Progressing   LCSW Treatment Plan for Primary Diagnosis: MDD (major depressive disorder) Long Term Goal(s): Safe transition to appropriate next level of care at discharge, Engage patient in therapeutic group addressing interpersonal concerns.  Short Term Goals: Engage patient in aftercare planning with referrals and resources and Identify triggers associated with mental health/substance abuse issues  Therapeutic Interventions: Assess for all discharge needs, 1 to 1 time with Social worker, Explore available resources and support systems, Assess for adequacy in community support network, Educate family and significant other(s) on suicide prevention, Complete Psychosocial Assessment, Interpersonal group therapy.  Evaluation of Outcomes: Progressing   Progress  in Treatment: Attending groups: Yes. Participating in groups: Yes. Taking medication as prescribed: Yes. Toleration medication: Yes. Family/Significant other contact made: Contact attempts made with pt's partner. SPE completed with pt as well.  Patient understands diagnosis: Yes. Discussing patient identified problems/goals with staff: Yes. Medical problems stabilized or resolved: Yes. Denies suicidal/homicidal ideation: Yes. Issues/concerns per patient self-inventory: No. Other: n/a   New problem(s) identified: No, Describe:  n/a  New Short Term/Long Term Goal(s): detox, medication management for mood stabilization; elimination of SI thoughts; development of comprehensive mental wellness/sobriety plan.   Patient Goals:  "To learn how to stop being impulsive."   Discharge Plan or Barriers:   Reason for Continuation of Hospitalization: Anxiety Depression Medication stabilization Suicidal ideation  Estimated Length of Stay: Thursday, 09/08/17  Attendees: Patient: 09/07/2017 8:53 AM  Physician: Dr. Viviano SimasMaurer MD; Dr. Jola Babinskilary MD 09/07/2017 8:53 AM  Nursing: Arlyss RepressAlyssa RN; [facility] RN 09/07/2017 8:53 AM  RN Care Manager:x 09/07/2017 8:53 AM  Social Worker: Corrie MckusickHeather Ticara Waner LCSW 09/07/2017 8:53 AM  Recreational Therapist: x 09/07/2017 8:53 AM  Other: Armandina StammerAgnes Nwoko NP; Renold DonLashaunda Thomas NP 09/07/2017 8:53 AM  Other:  09/07/2017 8:53 AM  Other: 09/07/2017 8:53 AM    Scribe for Treatment Team: Rona RavensHeather S Meda Dudzinski, LCSW 09/07/2017 8:53 AM

## 2017-09-07 NOTE — [facility] Group Notes (Signed)
[facility] Group Notes:  (Nursing/MHT/Case Management/Adjunct)  Date:  09/07/2017  Time:  4:30 pm  Type of Therapy:  Psychoeducational Skills  Participation Level:  Active  Participation Quality:  Appropriate  Affect:  Appropriate  Cognitive:  Appropriate  Insight:  Appropriate  Engagement in Group:  Engaged  Modes of Intervention:  Education  Summary of Progress/Problems:  Patient was alert and cooperative during group.  Earline MayotteKnight, [place] 09/07/2017, 5:53 PM

## 2017-09-08 DIAGNOSIS — X789XXA Intentional self-harm by unspecified sharp object, initial encounter: Secondary | ICD-10-CM

## 2017-09-08 DIAGNOSIS — Z630 Problems in relationship with spouse or partner: Secondary | ICD-10-CM

## 2017-09-08 DIAGNOSIS — F1990 Other psychoactive substance use, unspecified, uncomplicated: Secondary | ICD-10-CM

## 2017-09-08 DIAGNOSIS — F1490 Cocaine use, unspecified, uncomplicated: Secondary | ICD-10-CM

## 2017-09-08 DIAGNOSIS — Z560 Unemployment, unspecified: Secondary | ICD-10-CM

## 2017-09-08 MED ORDER — TRAZODONE HCL 50 MG PO TABS: 50.0000 mg | ORAL_TABLET | Freq: Every evening | ORAL | 0 refills | Status: DC | PRN

## 2017-09-08 MED ORDER — HYDROXYZINE HCL 25 MG PO TABS: 25.0000 mg | ORAL_TABLET | Freq: Three times a day (TID) | ORAL | 0 refills | Status: DC | PRN

## 2017-09-08 MED ORDER — DULOXETINE HCL 40 MG PO CPEP: 40.0000 mg | ORAL_CAPSULE | Freq: Every day | ORAL | 0 refills | Status: DC

## 2017-09-08 MED ORDER — MUPIROCIN CALCIUM 2 % EX CREA: TOPICAL_CREAM | Freq: Two times a day (BID) | CUTANEOUS | 0 refills | Status: DC
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
## 2017-09-08 NOTE — Progress Notes (Signed)
D: Pt A & O X 4. Presents animated on initial approach. Rates her depression, anxiety and hopelessness 0/10. Reports she's eating and sleeping well with high energy and good concentration level.  Denies SI, HI, AVH and pain at this time. D/C home as ordered. Picked up in lobby by her boyfriend. A: D/C instructions reviewed with pt including prescriptions and follow up appointment; compliance encouraged. All belongings from locker # 34 given to pt at time of departure. Scheduled medications given with verbal education and effects monitored. All belongings from locker 34 returned to patient at time of d/c. Safety checks maintained without incident till time of d/c.  R: Pt receptive to care. Compliant with medications when offered. Denies adverse drug reactions when assessed. Verbalized understanding related to d/c instructions. Signed belonging sheet in agreement with items received from locker. Ambulatory with a steady gait. Appears to be in no physical distress at time of departure.

## 2017-09-08 NOTE — Progress Notes (Signed)
  [facility] :  Will you be returning to the same living situation after discharge:  Yes,  with boyfriend and son. At discharge, do you have transportation home?: Yes,  with boyfriend, [place].  Do you have the ability to pay for your medications: Yes,  [employer]  Release of information consent forms completed and in the chart;  Patient's signature needed at discharge.  Patient to Follow up at: Follow-up Information    [facility] Call.   Why:  Referral completed. Patient to call and schedule appointment.  Contact information: [address] [place] [address] [phone]           Next level of care provider has [facility] Link:no  Safety Planning and Suicide Prevention discussed: Yes,  completed with boyfriend, [place]  Have you used any form of tobacco in the last 30 days? (Cigarettes, Smokeless Tobacco, Cigars, and/or Pipes): Yes  Has patient been referred to the Quitline?: N/A patient is not a smoker  Patient has been referred for addiction treatment: N/A  Magdalene Mollyerri A Tomeeka Plaugher, LCSW 09/08/2017, 11:28 AM

## 2017-09-08 NOTE — [facility] Suicide Risk Assessment (Signed)
[facility]   Principal Problem: MDD (major depressive disorder) Discharge Diagnoses:  Patient Active Problem List   Diagnosis Date Noted  . MDD (major depressive disorder) [F32.9] 09/05/2017  . Self-inflicted injury [Z72.89]   . Normal labor and delivery [O80] 01/10/2017  . Elevated blood pressure affecting pregnancy, antepartum [O16.9] 01/03/2017  . History of depression [Z86.59] 12/11/2016  . Acute bronchitis due to infection [J20.8] 04/20/2015  . Routine health maintenance [Z00.00] 04/08/2011    Total Time spent with patient: 30 minutes  Musculoskeletal: Strength & Muscle Tone: within normal limits Gait & Station: normal Patient leans: N/A  Psychiatric Specialty Exam: Review of Systems  All other systems reviewed and are negative.   Blood pressure 127/80, pulse 95, temperature 98.3 F (36.8 C), temperature source Oral, resp. rate 16, height 5\' 5"  (1.651 m), weight 83.9 kg (185 lb), last menstrual period 08/09/2017, SpO2 100 %, not currently breastfeeding.Body mass index is 30.79 kg/m.  General Appearance: Casual  Eye Contact::  Good  Speech:  Clear and Coherent409  Volume:  Normal  Mood:  Euthymic  Affect:  Congruent  Thought Process:  Coherent  Orientation:  Full (Time, Place, and Person)  Thought Content:  Logical  Suicidal Thoughts:  No  Homicidal Thoughts:  No  Memory:  Immediate;   Fair Recent;   Fair Remote;   Fair  Judgement:  Intact  Insight:  Fair  Psychomotor Activity:  Normal  Concentration:  Good  Recall:  Good  Fund of Knowledge:Good  Language: Good  Akathisia:  Negative  Handed:  Right  AIMS (if indicated):     Assets:  Communication Skills Desire for Improvement Housing Physical Health Resilience  Sleep:  Number of Hours: 6.5  Cognition: WNL  ADL's:  Intact   Mental Status Per Nursing Assessment::   On Admission:  Self-harm thoughts, Self-harm behaviors  Demographic Factors:  Adolescent or young adult and  Caucasian  Loss Factors: NA  Historical Factors: Impulsivity  Risk Reduction Factors:   Sense of responsibility to family, Positive social support and Positive coping skills or problem solving skills  Continued Clinical Symptoms:  Depression:   Impulsivity Alcohol/Substance Abuse/Dependencies  Cognitive Features That Contribute To Risk:  None    Suicide Risk:  Minimal: No identifiable suicidal ideation.  Patients presenting with no risk factors but with morbid ruminations; may be classified as minimal risk based on the severity of the depressive symptoms  Follow-up Information    [facility].   Contact information: [address] [place] [address] [phone]           Plan Of Care/Follow-up recommendations:  Activity:  ad lib  Antonieta PertGreg Lawson Clary, MD 09/08/2017, 9:48 AM

## 2017-09-08 NOTE — Plan of Care (Signed)
  Problem: Education: Goal: Knowledge of [facility] Education information/materials will improve Outcome: Adequate for Discharge Goal: Emotional status will improve Outcome: Adequate for Discharge Goal: Mental status will improve Outcome: Adequate for Discharge Goal: Verbalization of understanding the information provided will improve Outcome: Adequate for Discharge   Problem: Activity: Goal: Interest or engagement in activities will improve Outcome: Adequate for Discharge Goal: Sleeping patterns will improve Outcome: Adequate for Discharge   Problem: Coping: Goal: Ability to verbalize frustrations and anger appropriately will improve Outcome: Adequate for Discharge Goal: Ability to demonstrate self-control will improve Outcome: Adequate for Discharge   Problem: Health Behavior/Discharge Planning: Goal: Identification of resources available to assist in meeting health care needs will improve Outcome: Adequate for Discharge Goal: Compliance with treatment plan for underlying cause of condition will improve Outcome: Adequate for Discharge   Problem: Physical Regulation: Goal: Ability to maintain clinical measurements within normal limits will improve Outcome: Adequate for Discharge   Problem: Safety: Goal: Periods of time without injury will increase Outcome: Adequate for Discharge   Problem: Education: Goal: Ability to state activities that reduce stress will improve Outcome: Adequate for Discharge   Problem: Coping: Goal: Ability to identify and develop effective coping behavior will improve Outcome: Adequate for Discharge   Problem: Self-Concept: Goal: Ability to identify factors that promote anxiety will improve Outcome: Adequate for Discharge Goal: Level of anxiety will decrease Outcome: Adequate for Discharge Goal: Ability to modify response to factors that promote anxiety will improve Outcome: Adequate for Discharge   Problem: Education: Goal:  Utilization of techniques to improve thought processes will improve Outcome: Adequate for Discharge Goal: Knowledge of the prescribed therapeutic regimen will improve Outcome: Adequate for Discharge   Problem: Activity: Goal: Interest or engagement in leisure activities will improve Outcome: Adequate for Discharge Goal: Imbalance in normal sleep/wake cycle will improve Outcome: Adequate for Discharge   Problem: Coping: Goal: Coping ability will improve Outcome: Adequate for Discharge Goal: Will verbalize feelings Outcome: Adequate for Discharge   Problem: Health Behavior/Discharge Planning: Goal: Ability to make decisions will improve Outcome: Adequate for Discharge Goal: Compliance with therapeutic regimen will improve Outcome: Adequate for Discharge   Problem: Role Relationship: Goal: Will demonstrate positive changes in social behaviors and relationships Outcome: Adequate for Discharge   Problem: Safety: Goal: Ability to disclose and discuss suicidal ideas will improve Outcome: Adequate for Discharge Goal: Ability to identify and utilize support systems that promote safety will improve Outcome: Adequate for Discharge   Problem: Education: Goal: Ability to make informed decisions regarding treatment will improve Outcome: Adequate for Discharge   Problem: Coping: Goal: Coping ability will improve Outcome: Adequate for Discharge   Problem: Medication: Goal: Compliance with prescribed medication regimen will improve Outcome: Adequate for Discharge   Problem: Self-Concept: Goal: Ability to disclose and discuss suicidal ideas will improve Outcome: Adequate for Discharge Goal: Will verbalize positive feelings about self Outcome: Adequate for Discharge

## 2017-09-08 NOTE — Progress Notes (Signed)
Pt reports she had a great day and is looking forward to discharge home tomorrow.  She denies SI/HI/AVH.  She voices no needs or concerns.  Pt's L forearm laceration appears to be free of s/s of infection.  It is open to air and antibiotic cream had been applied prior to the change of shift.  Pt makes her needs known to staff.  She has been in the dayroom all evening playing cards and socializing with other patients.  She attended evening NA group.  Support and encouragement offered.  Discharge plans are in process.  Safety maintained with q15 minute checks.

## 2017-09-08 NOTE — [facility] Suicide Risk Assessment (Signed)
[facility] INPATIENT:  Family/Significant Other Suicide Prevention Education  Suicide Prevention Education:  Education Completed; [place] (Boyfriend- [phone]), has been identified by the patient as the family member/significant other with whom the patient will be residing, and identified as the person(s) who will aid the patient in the event of a mental health crisis (suicidal ideations/suicide attempt).  With written consent from the patient, the family member/significant other has been provided the following suicide prevention education, prior to the and/or following the discharge of the patient.  The suicide prevention education provided includes the following:  Suicide risk factors  Suicide prevention and interventions  National Suicide Hotline telephone number  [facility] assessment telephone number  [facility] Emergency Assistance 911  [facility] and/or Residential Mobile Crisis Unit telephone number  Request made of family/significant other to:  Remove weapons (e.g., guns, rifles, knives), all items previously/currently identified as safety concern.    Remove drugs/medications (over-the-counter, prescriptions, illicit drugs), all items previously/currently identified as a safety concern.  The family member/significant other verbalizes understanding of the suicide prevention education information provided.  The family member/significant other agrees to remove the items of safety concern listed above. Boyfriend states there are no guns or weapons in the home. CSW requested boyfriend purchase lockbox/safe to store potentially harmful items, including knives, razors, scissors and medications. CSW requested increased monitoring in the home. Boyfriend stated he will put items in the shed for now, and will be purchasing a lockbox as soon as possible. He states he will make sure patient attend all aftercare appointments, and will continue to provide patient with  emotional support.   Magdalene MollyPerri A Samual Beals, LCSW 09/08/2017, 11:21 AM

## 2018-12-04 ENCOUNTER — Other Ambulatory Visit (HOSPITAL_COMMUNITY): Payer: Self-pay | Admitting: Nurse Practitioner

## 2018-12-04 DIAGNOSIS — Z369 Encounter for antenatal screening, unspecified: Secondary | ICD-10-CM

## 2018-12-04 DIAGNOSIS — Z3A13 13 weeks gestation of pregnancy: Secondary | ICD-10-CM

## 2018-12-05 ENCOUNTER — Encounter (HOSPITAL_COMMUNITY): Payer: Self-pay | Admitting: *Deleted

## 2018-12-07 LAB — OB RESULTS CONSOLE HIV ANTIBODY (ROUTINE TESTING): HIV: NONREACTIVE

## 2018-12-07 LAB — OB RESULTS CONSOLE RUBELLA ANTIBODY, IGM: Rubella: IMMUNE

## 2018-12-07 LAB — OB RESULTS CONSOLE HEPATITIS B SURFACE ANTIGEN: Hepatitis B Surface Ag: NEGATIVE

## 2018-12-07 LAB — OB RESULTS CONSOLE GC/CHLAMYDIA
Chlamydia: NEGATIVE
Gonorrhea: NEGATIVE

## 2018-12-07 LAB — OB RESULTS CONSOLE ANTIBODY SCREEN: Antibody Screen: NEGATIVE

## 2018-12-07 LAB — OB RESULTS CONSOLE ABO/RH: RH Type: POSITIVE

## 2018-12-07 LAB — OB RESULTS CONSOLE RPR: RPR: NONREACTIVE

## 2018-12-08 ENCOUNTER — Encounter (HOSPITAL_COMMUNITY): Payer: Self-pay

## 2018-12-08 ENCOUNTER — Ambulatory Visit (HOSPITAL_COMMUNITY): Payer: BLUE CROSS/BLUE SHIELD

## 2018-12-08 ENCOUNTER — Other Ambulatory Visit: Payer: Self-pay

## 2018-12-08 ENCOUNTER — Ambulatory Visit (HOSPITAL_COMMUNITY)
Admission: RE | Admit: 2018-12-08 | Discharge: 2018-12-08 | Disposition: A | Discharge status: Home / Self Care | Payer: BLUE CROSS/BLUE SHIELD | Source: Ambulatory Visit | Attending: Obstetrics and Gynecology | Admitting: Obstetrics and Gynecology

## 2018-12-08 ENCOUNTER — Ambulatory Visit (HOSPITAL_COMMUNITY): Payer: BLUE CROSS/BLUE SHIELD | Admitting: *Deleted

## 2018-12-08 VITALS — BP 134/84 | HR 127 | Temp 98.5°F | Wt 209.1 lb

## 2018-12-08 DIAGNOSIS — Z369 Encounter for antenatal screening, unspecified: Secondary | ICD-10-CM

## 2018-12-08 DIAGNOSIS — O99211 Obesity complicating pregnancy, first trimester: Secondary | ICD-10-CM

## 2018-12-08 DIAGNOSIS — Z3A13 13 weeks gestation of pregnancy: Secondary | ICD-10-CM | POA: Insufficient documentation

## 2018-12-08 DIAGNOSIS — O139 Gestational [pregnancy-induced] hypertension without significant proteinuria, unspecified trimester: Secondary | ICD-10-CM | POA: Insufficient documentation

## 2018-12-08 DIAGNOSIS — O09291 Supervision of pregnancy with other poor reproductive or obstetric history, first trimester: Secondary | ICD-10-CM | POA: Diagnosis not present

## 2018-12-08 DIAGNOSIS — O34219 Maternal care for unspecified type scar from previous cesarean delivery: Secondary | ICD-10-CM | POA: Diagnosis not present

## 2018-12-12 LAB — FIRST TRIMESTER SCREEN W/NT
CRL: 82.9 mm
DIA MoM: 1.03
DIA Value: 158.4 pg/mL
Gest Age-Collect: 13.9 weeks
Maternal Age At EDD: 23.3 yr
Nuchal Translucency MoM: 0.65
Nuchal Translucency: 1.6 mm
Number of Fetuses: 1
PAPP-A MoM: 0.29
PAPP-A Value: 294.7 ng/mL
Test Results:: NEGATIVE
Weight: 209 [lb_av]
hCG MoM: 0.41
hCG Value: 24.9 IU/mL

## 2018-12-13 ENCOUNTER — Telehealth (HOSPITAL_COMMUNITY): Payer: Self-pay | Admitting: Genetic Counselor

## 2018-12-13 NOTE — Telephone Encounter (Signed)
LVM for Ms. Vandeberg re: good news about screening results. Requested a call back to my direct line to discuss these in more detail, as no identifiers were provided in voicemail message.   Buelah Manis, MS Genetic Counselor

## 2019-03-19 DIAGNOSIS — F329 Major depressive disorder, single episode, unspecified: Secondary | ICD-10-CM | POA: Diagnosis not present

## 2019-03-19 DIAGNOSIS — Z23 Encounter for immunization: Secondary | ICD-10-CM | POA: Diagnosis not present

## 2019-03-19 DIAGNOSIS — Z3482 Encounter for supervision of other normal pregnancy, second trimester: Secondary | ICD-10-CM | POA: Diagnosis not present

## 2019-03-19 DIAGNOSIS — Z3483 Encounter for supervision of other normal pregnancy, third trimester: Secondary | ICD-10-CM | POA: Diagnosis not present

## 2019-03-30 ENCOUNTER — Emergency Department (HOSPITAL_BASED_OUTPATIENT_CLINIC_OR_DEPARTMENT_OTHER)
Admission: EM | Admit: 2019-03-30 | Discharge: 2019-03-30 | Disposition: A | Discharge status: Home / Self Care | Payer: 59 | Attending: Emergency Medicine | Admitting: Emergency Medicine

## 2019-03-30 ENCOUNTER — Encounter (HOSPITAL_BASED_OUTPATIENT_CLINIC_OR_DEPARTMENT_OTHER): Payer: Self-pay | Admitting: *Deleted

## 2019-03-30 ENCOUNTER — Other Ambulatory Visit: Payer: Self-pay

## 2019-03-30 DIAGNOSIS — Z3A29 29 weeks gestation of pregnancy: Secondary | ICD-10-CM | POA: Diagnosis not present

## 2019-03-30 DIAGNOSIS — O9A213 Injury, poisoning and certain other consequences of external causes complicating pregnancy, third trimester: Secondary | ICD-10-CM | POA: Diagnosis not present

## 2019-03-30 DIAGNOSIS — M545 Low back pain, unspecified: Secondary | ICD-10-CM

## 2019-03-30 DIAGNOSIS — Z87891 Personal history of nicotine dependence: Secondary | ICD-10-CM | POA: Diagnosis not present

## 2019-03-30 DIAGNOSIS — Z79899 Other long term (current) drug therapy: Secondary | ICD-10-CM | POA: Diagnosis not present

## 2019-03-30 MED ORDER — CYCLOBENZAPRINE HCL 10 MG PO TABS: 10.0000 mg | ORAL_TABLET | Freq: Two times a day (BID) | ORAL | 0 refills | Status: DC | PRN

## 2019-03-30 MED ORDER — ACETAMINOPHEN 500 MG PO TABS
1000.0000 mg | ORAL_TABLET | Freq: Once | ORAL | Status: AC
Start: 2019-03-30 — End: 2019-03-30
  Administered 2019-03-30: 13:00:00 1000 mg via ORAL
  Filled 2019-03-30: qty 2

## 2019-03-30 MED ORDER — CYCLOBENZAPRINE HCL 10 MG PO TABS
10.0000 mg | ORAL_TABLET | Freq: Once | ORAL | Status: AC
  Administered 2019-03-30: 13:00:00 10 mg via ORAL
  Filled 2019-03-30: qty 1

## 2019-03-30 MED ORDER — LIDOCAINE 5 % EX PTCH: 1.0000 | MEDICATED_PATCH | CUTANEOUS | 0 refills | Status: DC

## 2019-03-30 NOTE — ED Triage Notes (Signed)
[redacted] weeks pregnant. She bent over, head a pop and has had severe pain in her coccyx since. She is ambulatory. No contractions. She fluid leaking from her vagina. G2 P1.

## 2019-03-30 NOTE — ED Notes (Signed)
Received return call from RR nurse, Erin.  She said they have completed monitoring and pt is safe to remove monitor.   EDP, Dr. Adela Lank, has spoken with Denny Peon and received the recommendations from their OBGYN.

## 2019-03-30 NOTE — ED Provider Notes (Signed)
[facility] EMERGENCY DEPARTMENT Provider Note   CSN: [id number] Arrival date & time: 03/30/19  1210     History No chief complaint on file.   Christina Freeman is a 23 y.o. female.  24 yo F gravida 2 para 1 here with a chief complaints of low back pain.  Patient states that she bent over to do something and she felt a pop at her tailbone area.  Since then has had some cramping to her lower abdomen as well.  Scribes the pain is fairly severe.  Denies overt trauma to the area.  Denies any vaginal bleeding or discharge denies a rush of fluids.  Denies any urinary symptoms.  Her last pregnancy was complicated by preeclampsia.  No issues so far with this pregnancy.  The history is provided by the patient.       Past Medical History:  Diagnosis Date  . Anxiety   . Borderline personality disorder ([place])   . Depression   . History of posttraumatic stress disorder (PTSD)   . History of self-harm    cutting  last time approx March 2018  . Hx of acute pyelonephritis   . Ovarian cyst    left  . Pregnancy induced hypertension     Patient Active Problem List   Diagnosis Date Noted  . MDD (major depressive disorder) 09/05/2017  . Self-inflicted injury   . Normal labor and delivery 01/10/2017  . Elevated blood pressure affecting pregnancy, antepartum 01/03/2017  . History of depression 12/11/2016  . Acute bronchitis due to infection 04/20/2015  . Routine health maintenance 04/08/2011    Past Surgical History:  Procedure Laterality Date  . CESAREAN SECTION N/A 01/10/2017   Procedure: CESAREAN SECTION;  Surgeon: Tyson Dense, MD;  Location: [facility];  Service: Obstetrics;  Laterality: N/A;  . tubes in ears       OB History    Gravida  4   Para  1   Term  1   Preterm  0   AB  2   Living  1     SAB  2   TAB  0   Ectopic  0   Multiple  0   Live Births  1           Family History  Problem Relation Age of Onset  . Diabetes Father     . Hypertension Father     Social History   Tobacco Use  . Smoking status: Former Smoker    Packs/day: 0.50    Years: 4.00    Pack years: 2.00    Quit date: 09/05/2017    Years since quitting: 1.5  . Smokeless tobacco: Never Used  . Tobacco comment: 6 cigs daily  Substance Use Topics  . Alcohol use: Yes    Comment: one beer every other day sometimes  . Drug use: Not Currently    Types: Marijuana, Cocaine    Comment: cocaine last 08-2017, marijuana 10-2018    Home Medications Prior to Admission medications   Medication Sig Start Date End Date Taking? Authorizing Provider  Prenatal Vit w/Fe-Methylfol-FA (PNV PO) Take by mouth.   Yes [provider]  cyclobenzaprine (FLEXERIL) 10 MG tablet Take 1 tablet (10 mg total) by mouth 2 (two) times daily as needed for muscle spasms. 03/30/19   Deno Etienne, DO  Doxylamine Succinate, Sleep, (UNISOM PO) Take by mouth.    [provider]  DULoxetine HCl 40 MG CPEP Take 40 mg by mouth  daily. For depression Patient not taking: Reported on 12/08/2018 09/09/17   Armandina Stammer I, NP  hydrOXYzine (ATARAX/VISTARIL) 25 MG tablet Take 1 tablet (25 mg total) by mouth 3 (three) times daily as needed for anxiety. Patient not taking: Reported on 12/08/2018 09/08/17   Armandina Stammer I, NP  lidocaine (LIDODERM) 5 % Place 1 patch onto the skin daily. Remove & Discard patch within 12 hours or as directed by MD 03/30/19   Melene Plan, DO  mupirocin cream (BACTROBAN) 2 % Apply topically 2 (two) times daily. For wound care Patient not taking: Reported on 12/08/2018 09/08/17   Armandina Stammer I, NP  Pyridoxine HCl (VITAMIN B-6 PO) Take by mouth.    [provider]  traZODone (DESYREL) 50 MG tablet Take 1 tablet (50 mg total) by mouth at bedtime as needed for sleep. Patient not taking: Reported on 12/08/2018 09/08/17   Armandina Stammer I, NP    Allergies    Patient has no known allergies.  Review of Systems   Review of Systems  Constitutional: Negative  for chills and fever.  HENT: Negative for congestion and rhinorrhea.   Eyes: Negative for redness and visual disturbance.  Respiratory: Negative for shortness of breath and wheezing.   Cardiovascular: Negative for chest pain and palpitations.  Gastrointestinal: Positive for abdominal pain. Negative for nausea and vomiting.  Genitourinary: Negative for dysuria and urgency.  Musculoskeletal: Positive for back pain. Negative for arthralgias and myalgias.  Skin: Negative for pallor and wound.  Neurological: Negative for dizziness and headaches.    Physical Exam Updated Vital Signs BP 132/75   Pulse (!) 120 Comment: HR goes from 120 to 140. States her MD is aware of her tachycardia.  Temp 99 F (37.2 C) (Oral)   Resp 16   Ht 5\' 5"  (1.651 m)   Wt 106.6 kg   LMP 09/02/2018 (Exact Date)   SpO2 99%   BMI 39.11 kg/m   Physical Exam Vitals and nursing note reviewed.  Constitutional:      General: She is not in acute distress.    Appearance: She is well-developed. She is not diaphoretic.  HENT:     Head: Normocephalic and atraumatic.  Eyes:     Pupils: Pupils are equal, round, and reactive to light.  Cardiovascular:     Rate and Rhythm: Normal rate and regular rhythm.     Heart sounds: No murmur. No friction rub. No gallop.   Pulmonary:     Effort: Pulmonary effort is normal.     Breath sounds: No wheezing or rales.  Abdominal:     General: There is no distension.     Palpations: Abdomen is soft.     Tenderness: There is no abdominal tenderness.     Comments: Gravid no abdominal tenderness.  Musculoskeletal:        General: No tenderness.     Cervical back: Normal range of motion and neck supple.     Comments: Patient points down below her tailbone.  Palpation of the coccyx I do not produce any tenderness.  Skin:    General: Skin is warm and dry.  Neurological:     Mental Status: She is alert and oriented to person, place, and time.  Psychiatric:        Behavior: Behavior  normal.     ED Results / Procedures / Treatments   Labs (all labs ordered are listed, but only abnormal results are displayed) Labs Reviewed - No data to display  EKG None  Radiology No results found.  Procedures Procedures (including critical care time)  Medications Ordered in ED Medications  cyclobenzaprine (FLEXERIL) tablet 10 mg (has no administration in time range)  acetaminophen (TYLENOL) tablet 1,000 mg (1,000 mg Oral Given 03/30/19 1303)    ED Course  I have reviewed the triage vital signs and the nursing notes.  Pertinent labs & imaging results that were available during my care of the patient were reviewed by me and considered in my medical decision making (see chart for details).    MDM Rules/Calculators/A&P                      24 yo F with a chief complaints of tailbone pain and pelvic cramping.  Started after she bent over and felt a pop.  I feel like this would be an unlikely scenario where she would have a fracture to her tailbone.  We will hold off on imaging and spare her radiation to the pelvis.  We will give her a gram of Tylenol here.  As she is having lower abdominal cramping will place her on the monitor and have OB evaluate.  OB has evaluated the patient over her toco monitor.  No signs of contractions.  They felt she is cleared from their standpoint.  They recommended using Flexeril.  Recommended with her following up with a chiropractor or with the health department.  1:20 PM:  I have discussed the diagnosis/risks/treatment options with the patient and believe the pt to be eligible for discharge home to follow-up with OB. We also discussed returning to the ED immediately if new or worsening sx occur. We discussed the sx which are most concerning (e.g., sudden worsening pain, fever, inability to tolerate by mouth) that necessitate immediate return. Medications administered to the patient during their visit and any new prescriptions provided to the patient are  listed below.  Medications given during this visit Medications  cyclobenzaprine (FLEXERIL) tablet 10 mg (has no administration in time range)  acetaminophen (TYLENOL) tablet 1,000 mg (1,000 mg Oral Given 03/30/19 1303)     The patient appears reasonably screen and/or stabilized for discharge and I doubt any other medical condition or other [facility] requiring further screening, evaluation, or treatment in the ED at this time prior to discharge.   Final Clinical Impression(s) / ED Diagnoses Final diagnoses:  Acute bilateral low back pain without sciatica    Rx / DC Orders ED Discharge Orders         Ordered    cyclobenzaprine (FLEXERIL) 10 MG tablet  2 times daily PRN     03/30/19 1317    lidocaine (LIDODERM) 5 %  Every 24 hours     03/30/19 1319           [place], DO 03/30/19 1320

## 2019-03-30 NOTE — Discharge Instructions (Signed)
The OB/GYN folks felt that your symptoms were not due to labor.  The feel like this is muscular back pain.  Unfortunately in pregnancy most medications are not considered to be safe.  They did feel that the Flexeril would be a reasonable option.  Please follow-up with your OB/GYN.  They recommended following up with the health department or with a chiropractor if able.  You can try Lidoderm patches, though again there is been no specific study that would evaluate to see if there would be any harm to baby.

## 2019-03-30 NOTE — Progress Notes (Signed)
Late Entry: 1253 Received call regarding this 24 yo G4P1 @ 29.[redacted] wks GA in with report of bending over and feeling a pop at her tailbone area and now complaining of severs coccyx pain.  Denies vaginal bleeding, LOF from vagina, or UC's per ED RN. [phone] I requested that ED RN adjust EFM to trace better. The patient is currently standing due to severe pain when laying down in any position. 1310 Dr. Macon Large notified of pt in ED and of above. She states the patient can be OB cleared and she recommends treatment with flexeril for coccyx pain and referral to a chiropractor or back to the health department for PT referral. 1312 ED RN and MD notified the pt is OB cleared and of Dr. Mont Dutton recommendations.

## 2019-04-02 DIAGNOSIS — Z3483 Encounter for supervision of other normal pregnancy, third trimester: Secondary | ICD-10-CM | POA: Diagnosis not present

## 2019-04-02 DIAGNOSIS — O2600 Excessive weight gain in pregnancy, unspecified trimester: Secondary | ICD-10-CM | POA: Diagnosis not present

## 2019-04-02 DIAGNOSIS — Z789 Other specified health status: Secondary | ICD-10-CM | POA: Diagnosis not present

## 2019-04-02 DIAGNOSIS — F419 Anxiety disorder, unspecified: Secondary | ICD-10-CM | POA: Diagnosis not present

## 2019-04-25 ENCOUNTER — Other Ambulatory Visit: Payer: Self-pay | Admitting: Obstetrics and Gynecology

## 2019-05-03 DIAGNOSIS — Z3483 Encounter for supervision of other normal pregnancy, third trimester: Secondary | ICD-10-CM | POA: Diagnosis not present

## 2019-05-05 ENCOUNTER — Other Ambulatory Visit: Payer: Self-pay

## 2019-05-05 ENCOUNTER — Inpatient Hospital Stay (HOSPITAL_COMMUNITY)
Admission: AD | Admit: 2019-05-05 | Discharge: 2019-05-05 | Disposition: A | Discharge status: Home / Self Care | Payer: 59 | Attending: Obstetrics and Gynecology | Admitting: Obstetrics and Gynecology

## 2019-05-05 ENCOUNTER — Encounter (HOSPITAL_COMMUNITY): Payer: Self-pay | Admitting: Obstetrics and Gynecology

## 2019-05-05 DIAGNOSIS — O26899 Other specified pregnancy related conditions, unspecified trimester: Secondary | ICD-10-CM

## 2019-05-05 DIAGNOSIS — Z3A35 35 weeks gestation of pregnancy: Secondary | ICD-10-CM | POA: Diagnosis not present

## 2019-05-05 DIAGNOSIS — O26893 Other specified pregnancy related conditions, third trimester: Secondary | ICD-10-CM | POA: Diagnosis not present

## 2019-05-05 DIAGNOSIS — Z87891 Personal history of nicotine dependence: Secondary | ICD-10-CM | POA: Insufficient documentation

## 2019-05-05 DIAGNOSIS — R519 Headache, unspecified: Secondary | ICD-10-CM | POA: Diagnosis not present

## 2019-05-05 DIAGNOSIS — R109 Unspecified abdominal pain: Secondary | ICD-10-CM | POA: Insufficient documentation

## 2019-05-05 LAB — URINALYSIS, ROUTINE W REFLEX MICROSCOPIC
Bilirubin Urine: NEGATIVE
Glucose, UA: NEGATIVE mg/dL
Hgb urine dipstick: NEGATIVE
Ketones, ur: NEGATIVE mg/dL
Leukocytes,Ua: NEGATIVE
Nitrite: NEGATIVE
Protein, ur: NEGATIVE mg/dL
Specific Gravity, Urine: 1.008 (ref 1.005–1.030)
pH: 6 (ref 5.0–8.0)

## 2019-05-05 MED ORDER — BUTALBITAL-APAP-CAFFEINE 50-325-40 MG PO TABS: 1.0000 | ORAL_TABLET | Freq: Four times a day (QID) | ORAL | 0 refills | Status: DC | PRN

## 2019-05-05 MED ORDER — BUTALBITAL-APAP-CAFFEINE 50-325-40 MG PO TABS
2.0000 | ORAL_TABLET | Freq: Once | ORAL | Status: AC
  Administered 2019-05-05: 2 via ORAL
  Filled 2019-05-05: qty 2

## 2019-05-05 NOTE — MAU Provider Note (Signed)
Chief Complaint:  Contractions   First Provider Initiated Contact with Patient 05/05/19 2051      HPI: Christina Freeman is a 24 y.o. [id number] at [redacted]w[redacted]d who presents to maternity admissions reporting abdominal cramping, headache, and seeing spots in her vision since yesterday.  She had preeclampsia in her previous pregnancy and was concerned about these symptoms.  She started having cramping/contractions first, with intermittent, irregular pain in her lower abdomen radiating to her low back.  Then last night she developed a mild headache that worsened today.  The headache is constant, frontal , dull, and not resolved by Tylenol. She reports seeing spots in her vision intermittently today. There are no other associated symptoms. She has not tried any other treatments. She reports good fetal movement.  HPI  Past Medical History: Past Medical History:  Diagnosis Date  . Anxiety   . Borderline personality disorder (HCC)   . Depression   . History of posttraumatic stress disorder (PTSD)   . History of self-harm    cutting  last time approx March 2018  . Hx of acute pyelonephritis   . Ovarian cyst    left  . Pregnancy induced hypertension     Past obstetric history: OB History  Gravida Para Term Preterm AB Living  4 1 1  0 2 1  SAB TAB Ectopic Multiple Live Births  2 0 0 0 1    # Outcome Date GA Lbr Len/2nd Weight Sex Delivery Anes PTL Lv  4 Current           3 Term 01/10/17 [redacted]w[redacted]d  3310 g M CS-LTranv EPI  LIV  2 SAB 03/13/16          1 SAB             Past Surgical History: Past Surgical History:  Procedure Laterality Date  . CESAREAN SECTION N/A 01/10/2017   Procedure: CESAREAN SECTION;  Surgeon: 01/12/2017, MD;  Location: [facility] BIRTHING SUITES;  Service: Obstetrics;  Laterality: N/A;  . tubes in ears      Family History: Family History  Problem Relation Age of Onset  . Diabetes Father   . Hypertension Father     Social History: Social History   Tobacco Use  .  Smoking status: Former Smoker    Packs/day: 0.50    Years: 4.00    Pack years: 2.00    Quit date: 09/05/2017    Years since quitting: 1.6  . Smokeless tobacco: Never Used  . Tobacco comment: 6 cigs daily  Substance Use Topics  . Alcohol use: Not Currently  . Drug use: Not Currently    Types: Marijuana, Cocaine    Comment: cocaine last 08-2017, marijuana 10-2018    Allergies: No Known Allergies  Meds:  Medications Prior to Admission  Medication Sig Dispense Refill Last Dose  . acetaminophen (TYLENOL) 500 MG tablet Take 500 mg by mouth every 6 (six) hours as needed.   05/05/2019 at Unknown time  . BABY ASPIRIN PO Take by mouth.   05/05/2019 at Unknown time  . cyclobenzaprine (FLEXERIL) 10 MG tablet Take 1 tablet (10 mg total) by mouth 2 (two) times daily as needed for muscle spasms. 20 tablet 0 Past Month at Unknown time  . Prenatal Vit w/Fe-Methylfol-FA (PNV PO) Take by mouth.   05/05/2019 at Unknown time  . Pyridoxine HCl (VITAMIN B-6 PO) Take by mouth.   05/05/2019 at Unknown time  . Doxylamine Succinate, Sleep, (UNISOM PO) Take by mouth.     05/07/2019  DULoxetine HCl 40 MG CPEP Take 40 mg by mouth daily. For depression (Patient not taking: Reported on 12/08/2018) 30 capsule 0   . hydrOXYzine (ATARAX/VISTARIL) 25 MG tablet Take 1 tablet (25 mg total) by mouth 3 (three) times daily as needed for anxiety. (Patient not taking: Reported on 12/08/2018) 60 tablet 0   . lidocaine (LIDODERM) 5 % Place 1 patch onto the skin daily. Remove & Discard patch within 12 hours or as directed by MD 30 patch 0   . mupirocin cream (BACTROBAN) 2 % Apply topically 2 (two) times daily. For wound care (Patient not taking: Reported on 12/08/2018) 15 g 0   . traZODone (DESYREL) 50 MG tablet Take 1 tablet (50 mg total) by mouth at bedtime as needed for sleep. (Patient not taking: Reported on 12/08/2018) 30 tablet 0     ROS:  Review of Systems  Constitutional: Negative for chills, fatigue and fever.  Eyes: Positive for  visual disturbance.  Respiratory: Negative for shortness of breath.   Cardiovascular: Negative for chest pain.  Gastrointestinal: Positive for abdominal pain. Negative for nausea and vomiting.  Genitourinary: Negative for difficulty urinating, dysuria, flank pain, pelvic pain, vaginal bleeding, vaginal discharge and vaginal pain.  Neurological: Positive for headaches. Negative for dizziness.  Psychiatric/Behavioral: Negative.      I have reviewed patient's Past Medical Hx, Surgical Hx, Family Hx, Social Hx, medications and allergies.   Physical Exam   Patient Vitals for the past 24 hrs:  BP Temp Temp src Pulse Resp SpO2  05/05/19 2001 126/74 -- -- (!) 119 -- --  05/05/19 1951 139/83 98.3 F (36.8 C) Oral (!) 108 17 100 %   Constitutional: Well-developed, well-nourished female in no acute distress.  Cardiovascular: normal rate Respiratory: normal effort GI: Abd soft, non-tender, gravid appropriate for gestational age.  MS: Extremities nontender, no edema, normal ROM Neurologic: Alert and oriented x 4.  GU: Neg CVAT.   Cervix 0/thick/high by Fatima Blank, CNM      FHT:  Baseline 140 , moderate variability, accelerations present, no decelerations Contractions: q 3-15 mins, mild to palpation   Labs: Results for orders placed or performed during the hospital encounter of 05/05/19 (from the past 24 hour(s))  Urinalysis, Routine w reflex microscopic     Status: Abnormal   Collection Time: 05/05/19  7:52 PM  Result Value Ref Range   Color, Urine STRAW (A) YELLOW   APPearance CLEAR CLEAR   Specific Gravity, Urine 1.008 1.005 - 1.030   pH 6.0 5.0 - 8.0   Glucose, UA NEGATIVE NEGATIVE mg/dL   Hgb urine dipstick NEGATIVE NEGATIVE   Bilirubin Urine NEGATIVE NEGATIVE   Ketones, ur NEGATIVE NEGATIVE mg/dL   Protein, ur NEGATIVE NEGATIVE mg/dL   Nitrite NEGATIVE NEGATIVE   Leukocytes,Ua NEGATIVE NEGATIVE      Imaging:  No results found.  MAU Course/MDM: Orders Placed  This Encounter  Procedures  . Urinalysis, Routine w reflex microscopic    No orders of the defined types were placed in this encounter.    NST reviewed and reactive Although symptoms are similar to PEC, pt normotensive today, no HTN this pregnancy Headache and visual disturbances resolved with PO fluids and Fioricet x 2 Rx for Fioricet, BP check at [facility] on Monday or Tuesday, pt to call Pt discharge with strict PEC/return precautions.    Assessment:   1. Headache in pregnancy, antepartum, third trimester   2. Abdominal pain affecting pregnancy    Plan: Discharge home with [facility] precautions Labor  precautions and fetal kick counts   Allergies as of 05/05/2019   No Known Allergies     Medication List    TAKE these medications   acetaminophen 500 MG tablet Commonly known as: TYLENOL Take 500 mg by mouth every 6 (six) hours as needed.   BABY ASPIRIN PO Take by mouth.   butalbital-acetaminophen-caffeine 50-325-40 MG tablet Commonly known as: FIORICET Take 1-2 tablets by mouth every 6 (six) hours as needed for headache.   cyclobenzaprine 10 MG tablet Commonly known as: FLEXERIL Take 1 tablet (10 mg total) by mouth 2 (two) times daily as needed for muscle spasms.   DULoxetine HCl 40 MG Cpep Take 40 mg by mouth daily. For depression   hydrOXYzine 25 MG tablet Commonly known as: ATARAX/VISTARIL Take 1 tablet (25 mg total) by mouth 3 (three) times daily as needed for anxiety.   mupirocin cream 2 % Commonly known as: BACTROBAN Apply topically 2 (two) times daily. For wound care   PNV PO Take by mouth.   traZODone 50 MG tablet Commonly known as: DESYREL Take 1 tablet (50 mg total) by mouth at bedtime as needed for sleep.   VITAMIN B-6 PO Take by mouth.     ASK your doctor about these medications   lidocaine 5 % Commonly known as: Lidoderm Place 1 patch onto the skin daily. Remove & Discard patch within 12 hours or as directed by MD   UNISOM PO Take by  mouth.       Sharen Counter Certified Nurse-Midwife 05/05/2019 8:51 PM

## 2019-05-05 NOTE — MAU Note (Signed)
..  Christina Freeman is a 24 y.o. at [redacted]w[redacted]d here in MAU reporting: Irregular CTX all day. Pt reports +FM. No vaginal bleeding or LOF. Pt also reports a headache 6/10 that began last night. Pt said she took tylenol today at 1400 and did not find relief. Pt also reports that she is seeing spots.  BP 139/83 HR 108

## 2019-05-17 DIAGNOSIS — F329 Major depressive disorder, single episode, unspecified: Secondary | ICD-10-CM | POA: Diagnosis not present

## 2019-05-17 DIAGNOSIS — Z3483 Encounter for supervision of other normal pregnancy, third trimester: Secondary | ICD-10-CM | POA: Diagnosis not present

## 2019-05-17 DIAGNOSIS — Z23 Encounter for immunization: Secondary | ICD-10-CM | POA: Diagnosis not present

## 2019-05-17 DIAGNOSIS — Z8659 Personal history of other mental and behavioral disorders: Secondary | ICD-10-CM | POA: Diagnosis not present

## 2019-05-21 ENCOUNTER — Encounter (HOSPITAL_COMMUNITY): Payer: Self-pay

## 2019-05-21 NOTE — Patient Instructions (Signed)
Christina Freeman  05/21/2019   Your procedure is scheduled on:  06/02/2019  Arrive at 0730 at Entrance C on [address] at [facility]  and [employer]. You are invited to use the FREE valet parking or use the Visitor's parking deck.  Pick up the phone at the desk and dial [phone].  Call this number if you have problems the morning of surgery: [phone]  Remember:   Do not eat food:(After Midnight) Desps de medianoche.  Do not drink clear liquids: (After Midnight) Desps de medianoche.  Take these medicines the morning of surgery with A SIP OF WATER:  none   Do not wear jewelry, make-up or nail polish.  Do not wear lotions, powders, or perfumes. Do not wear deodorant.  Do not shave 48 hours prior to surgery.  Do not bring valuables to the hospital.  [facility] is not   responsible for any belongings or valuables brought to the hospital.  Contacts, dentures or bridgework may not be worn into surgery.  Leave suitcase in the car. After surgery it may be brought to your room.  For patients admitted to the hospital, checkout time is 11:00 AM the day of              discharge.      Please read over the following fact sheets that you were given:     Preparing for Surgery

## 2019-05-30 NOTE — Anesthesia Preprocedure Evaluation (Addendum)
Anesthesia Evaluation  Patient identified by MRN, date of birth, ID band Patient awake    Reviewed: Allergy & Precautions, NPO status , Patient's Chart, lab work & pertinent test results  History of Anesthesia Complications Negative for: history of anesthetic complications  Airway Mallampati: I  TM Distance: >3 FB Neck ROM: Full    Dental no notable dental hx.    Pulmonary former smoker,    Pulmonary exam normal        Cardiovascular hypertension (gestational), Normal cardiovascular exam     Neuro/Psych Anxiety Depression negative neurological ROS     GI/Hepatic Neg liver ROS, GERD  Medicated and Controlled,  Endo/Other  negative endocrine ROS  Renal/GU negative Renal ROS  negative genitourinary   Musculoskeletal negative musculoskeletal ROS (+)   Abdominal   Peds  Hematology negative hematology ROS (+)   Anesthesia Other Findings Day of surgery medications reviewed with patient.  Reproductive/Obstetrics (+) Pregnancy (Hx of C/S x1)                           Anesthesia Physical Anesthesia Plan  ASA: III  Anesthesia Plan: Spinal   Post-op Pain Management:    Induction:   PONV Risk Score and Plan: 4 or greater and Treatment may vary due to age or medical condition, Ondansetron and Dexamethasone  Airway Management Planned: Natural Airway  Additional Equipment: None  Intra-op Plan:   Post-operative Plan:   Informed Consent: I have reviewed the patients History and Physical, chart, labs and discussed the procedure including the risks, benefits and alternatives for the proposed anesthesia with the patient or authorized representative who has indicated his/her understanding and acceptance.       Plan Discussed with: CRNA  Anesthesia Plan Comments:        Anesthesia Quick Evaluation

## 2019-05-31 ENCOUNTER — Other Ambulatory Visit: Payer: Self-pay

## 2019-05-31 ENCOUNTER — Other Ambulatory Visit (HOSPITAL_COMMUNITY)
Admission: RE | Admit: 2019-05-31 | Discharge: 2019-05-31 | Disposition: A | Discharge status: Home / Self Care | Payer: 59 | Source: Ambulatory Visit | Attending: Obstetrics and Gynecology | Admitting: Obstetrics and Gynecology

## 2019-05-31 DIAGNOSIS — Z87891 Personal history of nicotine dependence: Secondary | ICD-10-CM | POA: Diagnosis not present

## 2019-05-31 DIAGNOSIS — Z23 Encounter for immunization: Secondary | ICD-10-CM | POA: Diagnosis not present

## 2019-05-31 DIAGNOSIS — Z20822 Contact with and (suspected) exposure to covid-19: Secondary | ICD-10-CM | POA: Diagnosis not present

## 2019-05-31 DIAGNOSIS — Z3483 Encounter for supervision of other normal pregnancy, third trimester: Secondary | ICD-10-CM | POA: Diagnosis not present

## 2019-05-31 DIAGNOSIS — F4310 Post-traumatic stress disorder, unspecified: Secondary | ICD-10-CM | POA: Diagnosis not present

## 2019-05-31 DIAGNOSIS — R030 Elevated blood-pressure reading, without diagnosis of hypertension: Secondary | ICD-10-CM | POA: Diagnosis not present

## 2019-05-31 DIAGNOSIS — Z62810 Personal history of physical and sexual abuse in childhood: Secondary | ICD-10-CM | POA: Diagnosis not present

## 2019-05-31 DIAGNOSIS — F329 Major depressive disorder, single episode, unspecified: Secondary | ICD-10-CM | POA: Diagnosis not present

## 2019-05-31 DIAGNOSIS — O34211 Maternal care for low transverse scar from previous cesarean delivery: Secondary | ICD-10-CM | POA: Diagnosis not present

## 2019-05-31 DIAGNOSIS — F419 Anxiety disorder, unspecified: Secondary | ICD-10-CM | POA: Diagnosis not present

## 2019-05-31 DIAGNOSIS — Z3A39 39 weeks gestation of pregnancy: Secondary | ICD-10-CM | POA: Diagnosis not present

## 2019-05-31 DIAGNOSIS — Z8659 Personal history of other mental and behavioral disorders: Secondary | ICD-10-CM | POA: Diagnosis not present

## 2019-05-31 DIAGNOSIS — O99344 Other mental disorders complicating childbirth: Secondary | ICD-10-CM | POA: Diagnosis not present

## 2019-05-31 DIAGNOSIS — Z30430 Encounter for insertion of intrauterine contraceptive device: Secondary | ICD-10-CM | POA: Diagnosis not present

## 2019-05-31 DIAGNOSIS — O99210 Obesity complicating pregnancy, unspecified trimester: Secondary | ICD-10-CM | POA: Diagnosis not present

## 2019-05-31 HISTORY — DX: Personal history of physical and sexual abuse in childhood: Z62.810

## 2019-05-31 LAB — COMPREHENSIVE METABOLIC PANEL
ALT: 10 U/L (ref 0–44)
AST: 13 U/L — ABNORMAL LOW (ref 15–41)
Albumin: 2.7 g/dL — ABNORMAL LOW (ref 3.5–5.0)
Alkaline Phosphatase: 101 U/L (ref 38–126)
Anion gap: 11 (ref 5–15)
BUN: 7 mg/dL (ref 6–20)
CO2: 22 mmol/L (ref 22–32)
Calcium: 8.8 mg/dL — ABNORMAL LOW (ref 8.9–10.3)
Chloride: 103 mmol/L (ref 98–111)
Creatinine, Ser: 0.67 mg/dL (ref 0.44–1.00)
GFR calc Af Amer: >60 mL/min (ref >60)
GFR calc non Af Amer: >60 mL/min (ref >60)
Glucose, Bld: 97 mg/dL (ref 70–99)
Potassium: 4.4 mmol/L (ref 3.5–5.1)
Sodium: 136 mmol/L (ref 135–145)
Total Bilirubin: 0.3 mg/dL (ref 0.3–1.2)
Total Protein: 6.4 g/dL — ABNORMAL LOW (ref 6.5–8.1)

## 2019-05-31 LAB — CBC
HCT: 39.3 % (ref 36.0–46.0)
Hemoglobin: 12.1 g/dL (ref 12.0–15.0)
MCH: 27.3 pg (ref 26.0–34.0)
MCHC: 30.8 g/dL (ref 30.0–36.0)
MCV: 88.7 fL (ref 80.0–100.0)
Platelets: 288 10*3/uL (ref 150–400)
RBC: 4.43 MIL/uL (ref 3.87–5.11)
RDW: 14.6 % (ref 11.5–15.5)
WBC: 10.7 10*3/uL — ABNORMAL HIGH (ref 4.0–10.5)
nRBC: 0 % (ref 0.0–0.2)

## 2019-05-31 LAB — RPR: RPR Ser Ql: NONREACTIVE

## 2019-05-31 LAB — TYPE AND SCREEN
ABO/RH(D): A POS
Antibody Screen: NEGATIVE

## 2019-05-31 LAB — ABO/RH: ABO/RH(D): A POS

## 2019-05-31 LAB — SARS CORONAVIRUS 2 (TAT 6-24 HRS): SARS Coronavirus 2: NEGATIVE

## 2019-06-01 DIAGNOSIS — Z23 Encounter for immunization: Secondary | ICD-10-CM | POA: Diagnosis not present

## 2019-06-01 DIAGNOSIS — R030 Elevated blood-pressure reading, without diagnosis of hypertension: Secondary | ICD-10-CM | POA: Diagnosis not present

## 2019-06-01 DIAGNOSIS — F329 Major depressive disorder, single episode, unspecified: Secondary | ICD-10-CM | POA: Diagnosis not present

## 2019-06-01 DIAGNOSIS — Z3483 Encounter for supervision of other normal pregnancy, third trimester: Secondary | ICD-10-CM | POA: Diagnosis not present

## 2019-06-02 ENCOUNTER — Encounter (HOSPITAL_COMMUNITY)
Admission: RE | Disposition: A | Discharge status: Home / Self Care | Payer: Self-pay | Source: Home / Self Care | Attending: Obstetrics and Gynecology

## 2019-06-02 ENCOUNTER — Encounter (HOSPITAL_COMMUNITY): Payer: Self-pay | Admitting: Obstetrics and Gynecology

## 2019-06-02 ENCOUNTER — Inpatient Hospital Stay (HOSPITAL_COMMUNITY): Payer: 59 | Admitting: Anesthesiology

## 2019-06-02 ENCOUNTER — Inpatient Hospital Stay (HOSPITAL_COMMUNITY)
Admission: RE | Admit: 2019-06-02 | Discharge: 2019-06-04 | DRG: 788 | Disposition: A | Discharge status: Home / Self Care | Payer: 59 | Attending: Obstetrics and Gynecology | Admitting: Obstetrics and Gynecology

## 2019-06-02 ENCOUNTER — Other Ambulatory Visit: Payer: Self-pay

## 2019-06-02 DIAGNOSIS — F329 Major depressive disorder, single episode, unspecified: Secondary | ICD-10-CM | POA: Diagnosis present

## 2019-06-02 DIAGNOSIS — F4310 Post-traumatic stress disorder, unspecified: Secondary | ICD-10-CM | POA: Diagnosis present

## 2019-06-02 DIAGNOSIS — Z87891 Personal history of nicotine dependence: Secondary | ICD-10-CM | POA: Diagnosis not present

## 2019-06-02 DIAGNOSIS — Z30014 Encounter for initial prescription of intrauterine contraceptive device: Secondary | ICD-10-CM | POA: Diagnosis not present

## 2019-06-02 DIAGNOSIS — Z3A39 39 weeks gestation of pregnancy: Secondary | ICD-10-CM | POA: Diagnosis not present

## 2019-06-02 DIAGNOSIS — O34219 Maternal care for unspecified type scar from previous cesarean delivery: Secondary | ICD-10-CM | POA: Diagnosis not present

## 2019-06-02 DIAGNOSIS — Z8659 Personal history of other mental and behavioral disorders: Secondary | ICD-10-CM

## 2019-06-02 DIAGNOSIS — Z975 Presence of (intrauterine) contraceptive device: Secondary | ICD-10-CM

## 2019-06-02 DIAGNOSIS — Z30430 Encounter for insertion of intrauterine contraceptive device: Secondary | ICD-10-CM | POA: Diagnosis not present

## 2019-06-02 DIAGNOSIS — Z20822 Contact with and (suspected) exposure to covid-19: Secondary | ICD-10-CM | POA: Diagnosis present

## 2019-06-02 DIAGNOSIS — O34211 Maternal care for low transverse scar from previous cesarean delivery: Secondary | ICD-10-CM | POA: Diagnosis not present

## 2019-06-02 DIAGNOSIS — O99344 Other mental disorders complicating childbirth: Secondary | ICD-10-CM | POA: Diagnosis present

## 2019-06-02 DIAGNOSIS — Z98891 History of uterine scar from previous surgery: Secondary | ICD-10-CM

## 2019-06-02 DIAGNOSIS — F419 Anxiety disorder, unspecified: Secondary | ICD-10-CM

## 2019-06-02 DIAGNOSIS — F331 Major depressive disorder, recurrent, moderate: Secondary | ICD-10-CM

## 2019-06-02 SURGERY — Surgical Case
Anesthesia: Spinal | Laterality: Bilateral

## 2019-06-02 MED ORDER — FENTANYL CITRATE (PF) 100 MCG/2ML IJ SOLN
INTRAMUSCULAR | Status: DC | PRN
  Administered 2019-06-02: 15 ug via INTRATHECAL

## 2019-06-02 MED ORDER — OXYCODONE-ACETAMINOPHEN 5-325 MG PO TABS
1.0000 | ORAL_TABLET | ORAL | Status: DC | PRN
  Administered 2019-06-03 – 2019-06-04 (×3): 1 via ORAL
  Filled 2019-06-02 (×4): qty 1

## 2019-06-02 MED ORDER — OXYTOCIN 40 UNITS IN NORMAL SALINE INFUSION - SIMPLE MED
INTRAVENOUS | Status: AC
  Filled 2019-06-02: qty 1000

## 2019-06-02 MED ORDER — OXYTOCIN 40 UNITS IN NORMAL SALINE INFUSION - SIMPLE MED
INTRAVENOUS | Status: DC | PRN
  Administered 2019-06-02: 40 [IU] via INTRAVENOUS

## 2019-06-02 MED ORDER — COCONUT OIL OIL
1.0000 "application " | TOPICAL_OIL | Status: DC | PRN
  Administered 2019-06-03: 1 via TOPICAL

## 2019-06-02 MED ORDER — MORPHINE SULFATE (PF) 0.5 MG/ML IJ SOLN
INTRAMUSCULAR | Status: AC
  Filled 2019-06-02: qty 10

## 2019-06-02 MED ORDER — DIPHENHYDRAMINE HCL 25 MG PO CAPS: 25.0000 mg | ORAL_CAPSULE | ORAL | Status: DC | PRN

## 2019-06-02 MED ORDER — ACETAMINOPHEN 500 MG PO TABS
1000.0000 mg | ORAL_TABLET | Freq: Four times a day (QID) | ORAL | Status: AC
  Administered 2019-06-02 – 2019-06-03 (×3): 1000 mg via ORAL
  Filled 2019-06-02 (×3): qty 2

## 2019-06-02 MED ORDER — OXYTOCIN 40 UNITS IN NORMAL SALINE INFUSION - SIMPLE MED: 2.5000 [IU]/h | INTRAVENOUS | Status: AC

## 2019-06-02 MED ORDER — DIPHENHYDRAMINE HCL 50 MG/ML IJ SOLN: 12.5000 mg | INTRAMUSCULAR | Status: DC | PRN

## 2019-06-02 MED ORDER — CEFAZOLIN SODIUM-DEXTROSE 2-4 GM/100ML-% IV SOLN
2.0000 g | INTRAVENOUS | Status: AC
  Administered 2019-06-02: 10:00:00 2 g via INTRAVENOUS

## 2019-06-02 MED ORDER — ENOXAPARIN SODIUM 60 MG/0.6ML ~~LOC~~ SOLN
50.0000 mg | SUBCUTANEOUS | Status: DC
  Administered 2019-06-03 – 2019-06-04 (×2): 50 mg via SUBCUTANEOUS
  Filled 2019-06-02 (×2): qty 0.6

## 2019-06-02 MED ORDER — SOD CITRATE-CITRIC ACID 500-334 MG/5ML PO SOLN
30.0000 mL | ORAL | Status: AC
  Administered 2019-06-02: 30 mL via ORAL

## 2019-06-02 MED ORDER — NALBUPHINE HCL 10 MG/ML IJ SOLN: 5.0000 mg | INTRAMUSCULAR | Status: DC | PRN

## 2019-06-02 MED ORDER — SOD CITRATE-CITRIC ACID 500-334 MG/5ML PO SOLN
ORAL | Status: AC
  Filled 2019-06-02: qty 30

## 2019-06-02 MED ORDER — FENTANYL CITRATE (PF) 100 MCG/2ML IJ SOLN
INTRAMUSCULAR | Status: AC
  Filled 2019-06-02: qty 2

## 2019-06-02 MED ORDER — SIMETHICONE 80 MG PO CHEW
80.0000 mg | CHEWABLE_TABLET | Freq: Three times a day (TID) | ORAL | Status: DC
  Administered 2019-06-02 – 2019-06-04 (×5): 80 mg via ORAL
  Filled 2019-06-02 (×6): qty 1

## 2019-06-02 MED ORDER — LACTATED RINGERS IV SOLN: INTRAVENOUS | Status: DC

## 2019-06-02 MED ORDER — CEFAZOLIN SODIUM-DEXTROSE 2-4 GM/100ML-% IV SOLN
INTRAVENOUS | Status: AC
  Filled 2019-06-02: qty 100

## 2019-06-02 MED ORDER — NALOXONE HCL 0.4 MG/ML IJ SOLN: 0.4000 mg | INTRAMUSCULAR | Status: DC | PRN

## 2019-06-02 MED ORDER — NALOXONE HCL 4 MG/10ML IJ SOLN
1.0000 ug/kg/h | INTRAVENOUS | Status: DC | PRN
  Filled 2019-06-02: qty 5

## 2019-06-02 MED ORDER — ACETAMINOPHEN 500 MG PO TABS: 1000.0000 mg | ORAL_TABLET | Freq: Once | ORAL | Status: DC

## 2019-06-02 MED ORDER — PROMETHAZINE HCL 25 MG/ML IJ SOLN: 6.2500 mg | INTRAMUSCULAR | Status: DC | PRN

## 2019-06-02 MED ORDER — KETOROLAC TROMETHAMINE 30 MG/ML IJ SOLN: 30.0000 mg | Freq: Once | INTRAMUSCULAR | Status: DC

## 2019-06-02 MED ORDER — ONDANSETRON HCL 4 MG/2ML IJ SOLN
INTRAMUSCULAR | Status: DC | PRN
  Administered 2019-06-02: 4 mg via INTRAVENOUS

## 2019-06-02 MED ORDER — IBUPROFEN 800 MG PO TABS
800.0000 mg | ORAL_TABLET | Freq: Three times a day (TID) | ORAL | Status: DC
  Administered 2019-06-02 – 2019-06-04 (×5): 800 mg via ORAL
  Filled 2019-06-02 (×5): qty 1

## 2019-06-02 MED ORDER — WITCH HAZEL-GLYCERIN EX PADS: 1.0000 "application " | MEDICATED_PAD | CUTANEOUS | Status: DC | PRN

## 2019-06-02 MED ORDER — DIPHENHYDRAMINE HCL 25 MG PO CAPS: 25.0000 mg | ORAL_CAPSULE | Freq: Four times a day (QID) | ORAL | Status: DC | PRN

## 2019-06-02 MED ORDER — NALBUPHINE HCL 10 MG/ML IJ SOLN: 5.0000 mg | Freq: Once | INTRAMUSCULAR | Status: DC | PRN

## 2019-06-02 MED ORDER — LEVONORGESTREL 19.5 MCG/DAY IU IUD
INTRAUTERINE_SYSTEM | INTRAUTERINE | Status: AC
  Filled 2019-06-02: qty 1

## 2019-06-02 MED ORDER — SIMETHICONE 80 MG PO CHEW: 80.0000 mg | CHEWABLE_TABLET | ORAL | Status: DC | PRN

## 2019-06-02 MED ORDER — LEVONORGESTREL 19.5 MCG/DAY IU IUD
INTRAUTERINE_SYSTEM | Freq: Once | INTRAUTERINE | Status: AC
  Administered 2019-06-02: 1 via INTRAUTERINE

## 2019-06-02 MED ORDER — DIBUCAINE (PERIANAL) 1 % EX OINT: 1.0000 "application " | TOPICAL_OINTMENT | CUTANEOUS | Status: DC | PRN

## 2019-06-02 MED ORDER — KETOROLAC TROMETHAMINE 30 MG/ML IJ SOLN
30.0000 mg | Freq: Four times a day (QID) | INTRAMUSCULAR | Status: AC | PRN
  Administered 2019-06-02: 30 mg via INTRAVENOUS
  Filled 2019-06-02: qty 1

## 2019-06-02 MED ORDER — ONDANSETRON HCL 4 MG/2ML IJ SOLN
INTRAMUSCULAR | Status: AC
  Filled 2019-06-02: qty 2

## 2019-06-02 MED ORDER — TETANUS-DIPHTH-ACELL PERTUSSIS 5-2.5-18.5 LF-MCG/0.5 IM SUSP: 0.5000 mL | Freq: Once | INTRAMUSCULAR | Status: DC

## 2019-06-02 MED ORDER — PHENYLEPHRINE HCL-NACL 20-0.9 MG/250ML-% IV SOLN
INTRAVENOUS | Status: DC | PRN
  Administered 2019-06-02: 60 ug/min via INTRAVENOUS

## 2019-06-02 MED ORDER — FENTANYL CITRATE (PF) 100 MCG/2ML IJ SOLN: 25.0000 ug | INTRAMUSCULAR | Status: DC | PRN

## 2019-06-02 MED ORDER — ONDANSETRON HCL 4 MG/2ML IJ SOLN
4.0000 mg | Freq: Three times a day (TID) | INTRAMUSCULAR | Status: DC | PRN
  Administered 2019-06-02: 4 mg via INTRAVENOUS
  Filled 2019-06-02: qty 2

## 2019-06-02 MED ORDER — KETOROLAC TROMETHAMINE 30 MG/ML IJ SOLN: 30.0000 mg | Freq: Four times a day (QID) | INTRAMUSCULAR | Status: AC | PRN

## 2019-06-02 MED ORDER — SIMETHICONE 80 MG PO CHEW
80.0000 mg | CHEWABLE_TABLET | ORAL | Status: DC
  Administered 2019-06-02 – 2019-06-03 (×2): 80 mg via ORAL
  Filled 2019-06-02 (×2): qty 1

## 2019-06-02 MED ORDER — MENTHOL 3 MG MT LOZG: 1.0000 | LOZENGE | OROMUCOSAL | Status: DC | PRN

## 2019-06-02 MED ORDER — MORPHINE SULFATE (PF) 0.5 MG/ML IJ SOLN
INTRAMUSCULAR | Status: DC | PRN
  Administered 2019-06-02: .15 mg via INTRATHECAL

## 2019-06-02 MED ORDER — PHENYLEPHRINE HCL-NACL 20-0.9 MG/250ML-% IV SOLN
INTRAVENOUS | Status: AC
  Filled 2019-06-02: qty 250

## 2019-06-02 MED ORDER — GABAPENTIN 100 MG PO CAPS
100.0000 mg | ORAL_CAPSULE | Freq: Three times a day (TID) | ORAL | Status: DC
  Administered 2019-06-02 – 2019-06-04 (×5): 100 mg via ORAL
  Filled 2019-06-02 (×5): qty 1

## 2019-06-02 MED ORDER — SENNOSIDES-DOCUSATE SODIUM 8.6-50 MG PO TABS
2.0000 | ORAL_TABLET | ORAL | Status: DC
  Administered 2019-06-02 – 2019-06-03 (×2): 2 via ORAL
  Filled 2019-06-02 (×2): qty 2

## 2019-06-02 MED ORDER — LACTATED RINGERS IV SOLN: INTRAVENOUS | Status: DC | PRN

## 2019-06-02 MED ORDER — SODIUM CHLORIDE 0.9% FLUSH: 3.0000 mL | INTRAVENOUS | Status: DC | PRN

## 2019-06-02 MED ORDER — SODIUM CHLORIDE 0.9 % IV SOLN: INTRAVENOUS | Status: DC | PRN

## 2019-06-02 MED ORDER — BUPIVACAINE IN DEXTROSE 0.75-8.25 % IT SOLN
INTRATHECAL | Status: DC | PRN
  Administered 2019-06-02: 1.6 mL via INTRATHECAL

## 2019-06-02 MED ORDER — PRENATAL MULTIVITAMIN CH
1.0000 | ORAL_TABLET | Freq: Every day | ORAL | Status: DC
  Administered 2019-06-03 – 2019-06-04 (×2): 1 via ORAL
  Filled 2019-06-02: qty 1

## 2019-06-02 MED ORDER — ACETAMINOPHEN 160 MG/5ML PO SOLN: 1000.0000 mg | Freq: Once | ORAL | Status: DC

## 2019-06-02 SURGICAL SUPPLY — 28 items
CLAMP CORD UMBIL (MISCELLANEOUS) IMPLANT
DRSG OPSITE POSTOP 4X10 (GAUZE/BANDAGES/DRESSINGS) ×3 IMPLANT
ELECT REM PT RETURN 9FT ADLT (ELECTROSURGICAL) ×3
ELECTRODE REM PT RTRN 9FT ADLT (ELECTROSURGICAL) ×1 IMPLANT
EXTRACTOR VACUUM M CUP 4 TUBE (SUCTIONS) IMPLANT
EXTRACTOR VACUUM M CUP 4' TUBE (SUCTIONS)
GLOVE BIOGEL PI IND STRL 6.5 (GLOVE) ×1 IMPLANT
GLOVE BIOGEL PI IND STRL 7.0 (GLOVE) ×1 IMPLANT
GLOVE BIOGEL PI INDICATOR 6.5 (GLOVE) ×2
GLOVE BIOGEL PI INDICATOR 7.0 (GLOVE) ×2
GLOVE SURG SS PI 6.0 STRL IVOR (GLOVE) ×3 IMPLANT
GOWN STRL REUS W/TWL LRG LVL3 (GOWN DISPOSABLE) ×6 IMPLANT
KIT ABG SYR 3ML LUER SLIP (SYRINGE) IMPLANT
NEEDLE HYPO 25X5/8 SAFETYGLIDE (NEEDLE) IMPLANT
NS IRRIG 1000ML POUR BTL (IV SOLUTION) ×3 IMPLANT
PACK C SECTION WH (CUSTOM PROCEDURE TRAY) ×3 IMPLANT
PAD ABD 7.5X8 STRL (GAUZE/BANDAGES/DRESSINGS) ×6 IMPLANT
PAD OB MATERNITY 4.3X12.25 (PERSONAL CARE ITEMS) ×3 IMPLANT
PENCIL SMOKE EVAC W/HOLSTER (ELECTROSURGICAL) ×3 IMPLANT
RTRCTR C-SECT PINK 25CM LRG (MISCELLANEOUS) IMPLANT
SEPRAFILM MEMBRANE 5X6 (MISCELLANEOUS) IMPLANT
SUT PLAIN 0 NONE (SUTURE) ×3 IMPLANT
SUT PLAIN 2 0 XLH (SUTURE) ×3 IMPLANT
SUT VIC AB 0 CT1 36 (SUTURE) ×12 IMPLANT
SUT VIC AB 4-0 KS 27 (SUTURE) ×3 IMPLANT
TOWEL OR 17X24 6PK STRL BLUE (TOWEL DISPOSABLE) ×3 IMPLANT
TRAY FOLEY W/BAG SLVR 14FR LF (SET/KITS/TRAYS/PACK) ×3 IMPLANT
WATER STERILE IRR 1000ML POUR (IV SOLUTION) ×3 IMPLANT

## 2019-06-02 NOTE — Anesthesia Postprocedure Evaluation (Signed)
Anesthesia Post Note  Patient: Navie Lamoreaux  Procedure(s) Performed: CESAREAN SECTION WITH BILATERAL TUBAL LIGATION (Bilateral )     Patient location during evaluation: PACU Anesthesia Type: Spinal Level of consciousness: awake and alert and oriented Pain management: pain level controlled Vital Signs Assessment: post-procedure vital signs reviewed and stable Respiratory status: spontaneous breathing, nonlabored ventilation and respiratory function stable Cardiovascular status: blood pressure returned to baseline Postop Assessment: no apparent nausea or vomiting Anesthetic complications: no    Last Vitals:  Vitals:   06/02/19 1125 06/02/19 1130  BP:  120/83  Pulse: 89 77  Resp: 18 (!) 22  Temp:  36.4 C  SpO2: 100% 100%    Last Pain:  Vitals:   06/02/19 1130  TempSrc: Axillary   Pain Goal:      LLE Sensation: Tingling (06/02/19 1130)   RLE Sensation: Tingling (06/02/19 1130) L Sensory Level: L3-Anterior knee, lower leg (06/02/19 1130) R Sensory Level: L3-Anterior knee, lower leg (06/02/19 1130) Epidural/Spinal Function Cutaneous sensation: Able to Wiggle Toes (06/02/19 1130), Patient able to flex knees: Yes (06/02/19 1130), Patient able to lift hips off bed: No (06/02/19 1130), Back pain beyond tenderness at insertion site: No (06/02/19 1130), Progressively worsening motor and/or sensory loss: No (06/02/19 1130), Bowel and/or bladder incontinence post epidural: No (06/02/19 1130)  Kaylyn Layer

## 2019-06-02 NOTE — Transfer of Care (Signed)
Immediate Anesthesia Transfer of Care Note  Patient: Christina Freeman  Procedure(s) Performed: CESAREAN SECTION WITH BILATERAL TUBAL LIGATION (Bilateral )  Patient Location: PACU  Anesthesia Type:Spinal  Level of Consciousness: awake, alert  and oriented  Airway & Oxygen Therapy: Patient Spontanous Breathing  Post-op Assessment: Report given to RN and Post -op Vital signs reviewed and stable  Post vital signs: Reviewed  Last Vitals:  Vitals Value Taken Time  BP 119/63 06/02/19 1038  Temp 36.8 C 06/02/19 1038  Pulse 85 06/02/19 1042  Resp 18 06/02/19 1042  SpO2 100 % 06/02/19 1042  Vitals shown include unvalidated device data.  Last Pain:  Vitals:   06/02/19 1038  TempSrc: Oral         Complications: No apparent anesthesia complications
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
## 2019-06-02 NOTE — Discharge Summary (Signed)
Postpartum Discharge Summary     Patient Name: Christina Freeman DOB: 1996/01/04 MRN: [id number]  Date of admission: 06/02/2019 Delivering Provider: CONSTANT, PEGGY   Date of discharge: 06/04/2019  Admitting diagnosis: History of cesarean section [Z98.891] Previous cesarean section complicating pregnancy, antepartum condition or complication [I29.798] Intrauterine pregnancy: [redacted]w[redacted]d    Secondary diagnosis:  Active Problems:   History of depression   MDD (major depressive disorder)   IUD (intrauterine device) in place   Cesarean delivery delivered   History of cesarean section   Previous cesarean section complicating pregnancy, antepartum condition or complication   Anxiety  Additional problems: None     Discharge diagnosis: Term Pregnancy Delivered                                                                                                Post partum procedures:Post placental Liletta IUD placement at time of cesarean  Augmentation: n/a  Complications: None  Hospital course:  Sceduled C/S   24y.o. yo [id number] admitted to the hospital 06/02/2019 for scheduled cesarean section with the following indication:Elective Repeat.  Membrane Rupture Time/Date: 9:58 AM ,06/02/2019   Patient delivered a Viable infant.06/02/2019  Details of operation can be found in separate operative note, but notable for placement of post-placental Liletta IUD.     Pateint had an uncomplicated postpartum course. Patient with significant psych history including severe postpartum depression after her first delivery and a suicide attempt at 8 months postpartum. SW consulted and did not identify barriers to discharge. Patient started on Cymbalta (as she had been on this previously) and Atarax. Referred to psychology and psychiatry on discharge. One isolated elevated BP throughout hospital stay and no further workup initiated. She is ambulating, tolerating a regular diet, passing flatus, and urinating well.  Patient is discharged home in stable condition on  06/04/19        Delivery time: 9:59 AM    Magnesium Sulfate received: No BMZ received: No Rhophylac:N/A MMR:N/A Transfusion:No  Physical exam  Vitals:   06/03/19 0535 06/03/19 1500 06/03/19 2128 06/04/19 0516  BP: 104/68 137/66 (!) 142/85 127/71  Pulse: 79 82 83 78  Resp: '18 18 18 14  '$ Temp: (!) 97.5 F (36.4 C) 99.1 F (37.3 C) 99.3 F (37.4 C) 98.3 F (36.8 C)  TempSrc: Axillary Oral Oral Oral  SpO2: 100%   99%  Weight:      Height:       General: alert, cooperative and no distress Lochia: appropriate Uterine Fundus: firm Incision: Dressing is clean, dry, and intact DVT Evaluation: No evidence of DVT seen on physical exam. Labs: Lab Results  Component Value Date   WBC 9.5 06/03/2019   HGB 10.6 (L) 06/03/2019   HCT 34.8 (L) 06/03/2019   MCV 89.5 06/03/2019   PLT 228 06/03/2019   CMP Latest Ref Rng & Units 05/31/2019  Glucose 70 - 99 mg/dL 97  BUN 6 - 20 mg/dL 7  Creatinine 0.44 - 1.00 mg/dL 0.67  Sodium 135 - 145 mmol/L 136  Potassium 3.5 - 5.1 mmol/L 4.4  Chloride 98 - 111 mmol/L  103  CO2 22 - 32 mmol/L 22  Calcium 8.9 - 10.3 mg/dL 8.8(L)  Total Protein 6.5 - 8.1 g/dL 6.4(L)  Total Bilirubin 0.3 - 1.2 mg/dL 0.3  Alkaline Phos 38 - 126 U/L 101  AST 15 - 41 U/L 13(L)  ALT 0 - 44 U/L 10   Edinburgh Score: Edinburgh Postnatal Depression Scale Screening Tool 06/02/2019  I have been able to laugh and see the funny side of things. 0  I have looked forward with enjoyment to things. 0  I have blamed myself unnecessarily when things went wrong. 1  I have been anxious or worried for no good reason. 1  I have felt scared or panicky for no good reason. 1  Things have been getting on top of me. 0  I have been so unhappy that I have had difficulty sleeping. 0  I have felt sad or miserable. 0  I have been so unhappy that I have been crying. 0  The thought of harming myself has occurred to me. 0  Edinburgh Postnatal  Depression Scale Total 3    Discharge instruction: per After Visit Summary and "Baby and Me Booklet".  After visit meds:  Allergies as of 06/04/2019   No Known Allergies     Medication List    STOP taking these medications   aspirin EC 81 MG tablet   butalbital-acetaminophen-caffeine 50-325-40 MG tablet Commonly known as: FIORICET   cyclobenzaprine 10 MG tablet Commonly known as: FLEXERIL   lidocaine 5 % Commonly known as: Lidoderm   prenatal multivitamin Tabs tablet     TAKE these medications   acetaminophen 500 MG tablet Commonly known as: TYLENOL Take 1,000 mg by mouth every 6 (six) hours as needed for mild pain or headache.   DULoxetine 60 MG capsule Commonly known as: CYMBALTA Take 1 capsule (60 mg total) by mouth daily.   hydrOXYzine 25 MG tablet Commonly known as: ATARAX/VISTARIL Take 1 tablet (25 mg total) by mouth 3 (three) times daily as needed for anxiety.   ibuprofen 800 MG tablet Commonly known as: ADVIL Take 1 tablet (800 mg total) by mouth every 8 (eight) hours.   omeprazole 20 MG capsule Commonly known as: PRILOSEC Take 20 mg by mouth daily as needed (For heartburn.).   oxyCODONE 5 MG immediate release tablet Commonly known as: Roxicodone Take 1 tablet (5 mg total) by mouth every 4 (four) hours as needed for severe pain.   PNV PO Take by mouth.   senna-docusate 8.6-50 MG tablet Commonly known as: Senokot-S Take 2 tablets by mouth daily. Start taking on: June 05, 2019       Diet: routine diet  Activity: Advance as tolerated. Pelvic rest for 6 weeks.   Outpatient follow up:6 weeks Follow up Appt:No future appointments. Follow up Visit:    Please schedule this patient for Postpartum visit in: 6 weeks with the following provider: Any provider In-Person For C/S patients schedule nurse incision check in weeks 2 weeks: yes Low risk pregnancy complicated by: n/a Delivery mode:  CS Anticipated Birth Control:  PP IUD Placed PP  Procedures needed: Inicision check and IUD string check  Schedule Integrated BH visit: yes     Newborn Data: Live born female  Birth Weight:  3295g APGAR: 58, 8  Newborn Delivery   Birth date/time: 06/02/2019 09:59:00 Delivery type: C-Section, Low Transverse Trial of labor: No C-section categorization: Repeat      Baby Feeding: Breast Disposition:home with mother   06/04/2019 Chauncey Mann,  MD   

## 2019-06-02 NOTE — Op Note (Addendum)
Christina Freeman PROCEDURE DATE: 06/02/2019  PREOPERATIVE DIAGNOSIS: Intrauterine pregnancy at  [redacted]w[redacted]d weeks gestation; patient declines vag del attempt and desire for contraception  POSTOPERATIVE DIAGNOSIS: The same  PROCEDURE:     Cesarean Section and Liletta IUD insertion  SURGEON:  Dr. Mora Bellman  ASSISTANT: Dr. Dione Plover  INDICATIONS: Christina Freeman is a 24 y.o. T7D2202 at [redacted]w[redacted]d scheduled for cesarean section secondary to patient declines vag del attempt.  The risks of cesarean section discussed with the patient included but were not limited to: bleeding which may require transfusion or reoperation; infection which may require antibiotics; injury to bowel, bladder, ureters or other surrounding organs; injury to the fetus; need for additional procedures including hysterectomy in the event of a life-threatening hemorrhage; placental abnormalities wth subsequent pregnancies, incisional problems, thromboembolic phenomenon and other postoperative/anesthesia complications. The patient concurred with the proposed plan, giving informed written consent for the procedure.    FINDINGS:  Viable female infant in cephalic presentation.  Apgars 5 and 8.  Clear amniotic fluid.  Intact placenta, three vessel cord.  Normal uterus, right fallopian tube and ovary. Left ovary not visualized. Dense adhesion between left mid anterior uterine body and anterior abdominal wall making it difficult to assess left adnexa  ANESTHESIA:    Spinal INTRAVENOUS FLUIDS:2400 ml ESTIMATED BLOOD LOSS: 211 ml URINE OUTPUT:  100 ml SPECIMENS: Placenta sent to L&D COMPLICATIONS: None immediate  PROCEDURE IN DETAIL:  The patient received intravenous antibiotics and had sequential compression devices applied to her lower extremities while in the preoperative area.  She was then taken to the operating room where anesthesia was induced and was found to be adequate. A foley catheter was placed into her bladder and attached to Christina Freeman gravity.  She was then placed in a dorsal supine position with a leftward tilt, and prepped and draped in a sterile manner. After an adequate timeout was performed, a Pfannenstiel skin incision was made with scalpel and carried through to the underlying layer of fascia. The fascia was incised in the midline and this incision was extended bilaterally using the Mayo scissors. Kocher clamps were applied to the superior aspect of the fascial incision and the underlying rectus muscles were dissected off bluntly. A similar process was carried out on the inferior aspect of the facial incision. The rectus muscles were separated in the midline bluntly and the peritoneum was entered bluntly. The Alexis self-retaining retractor was introduced into the abdominal cavity. Attention was turned to the lower uterine segment where a bladder flap was created, and a transverse hysterotomy was made with a scalpel and extended bilaterally bluntly. The infant was successfully delivered, and delayed cord clamping was performed for 1 minute. The cord was clamped and cut and infant was handed over to awaiting neonatology team. Uterine massage was then administered and the placenta delivered intact with three-vessel cord. The uterus was cleared of clot and debris.  A Lilletta IUD was placed at the uterine fundus. Strings were trimmed to 10 cm and threaded into the cervical canal using a Kelly clamp. The hysterotomy was closed with 0 Vicryl in a running locked fashion, and an imbricating layer was also placed with a 0 Vicryl. Overall, excellent hemostasis was noted. The pelvis copiously irrigated and cleared of all clot and debris. Hemostasis was confirmed on all surfaces.  The peritoneum and the muscles were reapproximated using 0 vicryl interrupted stitches. The fascia was then closed using 0 Vicryl in a running fashion.  The subcutaneous layer was reapproximated with plain gut and  the skin was closed in a subcuticular fashion using 3.0 Vicryl. The  patient tolerated the procedure well. Sponge, lap, instrument and needle counts were correct x 2. She was taken to the recovery room in stable condition.    Christina Winterton ConstantMD  06/02/2019 10:19 AM

## 2019-06-02 NOTE — Progress Notes (Signed)
During shift change the previous RN stated that she emptied the patient's foley at approximately 1800 and that the output over the last 4 hours averaged to 30 ml per hour.   Peter Minium 10:09 PM 06/02/2019

## 2019-06-02 NOTE — Anesthesia Procedure Notes (Signed)
Spinal  Patient location during procedure: OR Start time: 06/02/2019 9:33 AM End time: 06/02/2019 9:36 AM Staffing Performed: anesthesiologist  Anesthesiologist: Kaylyn Layer, MD Preanesthetic Checklist Completed: patient identified, IV checked, risks and benefits discussed, monitors and equipment checked, pre-op evaluation and timeout performed Spinal Block Patient position: sitting Prep: DuraPrep and site prepped and draped Patient monitoring: heart rate, continuous pulse ox and blood pressure Approach: midline Location: L3-4 Injection technique: single-shot Needle Needle type: Pencan  Needle gauge: 24 G Needle length: 10 cm Assessment Sensory level: T4 Additional Notes Risks, benefits, and alternative discussed. Patient gave consent to procedure. Prepped and draped in sitting position. Clear CSF obtained after one needle redirection. Positive terminal aspiration. No pain or paraesthesias with injection. Patient tolerated procedure well. Vital signs stable. Christina Greenhouse, MD

## 2019-06-03 ENCOUNTER — Other Ambulatory Visit: Payer: Self-pay

## 2019-06-03 LAB — CBC
HCT: 34.8 % — ABNORMAL LOW (ref 36.0–46.0)
Hemoglobin: 10.6 g/dL — ABNORMAL LOW (ref 12.0–15.0)
MCH: 27.2 pg (ref 26.0–34.0)
MCHC: 30.5 g/dL (ref 30.0–36.0)
MCV: 89.5 fL (ref 80.0–100.0)
Platelets: 228 10*3/uL (ref 150–400)
RBC: 3.89 MIL/uL (ref 3.87–5.11)
RDW: 14.7 % (ref 11.5–15.5)
WBC: 9.5 10*3/uL (ref 4.0–10.5)
nRBC: 0 % (ref 0.0–0.2)

## 2019-06-03 LAB — BIRTH TISSUE RECOVERY COLLECTION (PLACENTA DONATION)

## 2019-06-03 MED ORDER — HYDROXYZINE HCL 25 MG PO TABS: 25.0000 mg | ORAL_TABLET | Freq: Three times a day (TID) | ORAL | Status: DC | PRN

## 2019-06-03 MED ORDER — DULOXETINE HCL 60 MG PO CPEP
60.0000 mg | ORAL_CAPSULE | Freq: Every day | ORAL | Status: DC
  Administered 2019-06-03 – 2019-06-04 (×2): 60 mg via ORAL
  Filled 2019-06-03 (×2): qty 1

## 2019-06-03 NOTE — Progress Notes (Signed)
Post Partum Day 1 Subjective: Patient reports feeling well. She is tolerating PO. Not yet ambulating and Foley still in place. Pain well-controlled. Lochia minimal.  Objective: Blood pressure 104/68, pulse 79, temperature (!) 97.5 F (36.4 C), temperature source Axillary, resp. rate 18, height 5\' 5"  (1.651 m), weight 109.8 kg, last menstrual period 09/02/2018, SpO2 100 %, unknown if currently breastfeeding.  Physical Exam:  General: alert, cooperative and appears stated age 10: appropriate Uterine Fundus: firm Incision: healing well, no significant drainage DVT Evaluation: No evidence of DVT seen on physical exam.  Recent Labs    05/31/19 0913 06/03/19 0536  HGB 12.1 10.6*  HCT 39.3 34.8*    Assessment/Plan: Plan for discharge tomorrow or POD#3 Plan for Foley removal and ambulation today Vitals stable Hgb drop appropriate S/p IUD placement Breastfeeding   LOS: 1 day   Asencion Guisinger N Magnum Lunde 06/03/2019, 7:56 AM

## 2019-06-03 NOTE — Clinical Social Work Maternal (Signed)
CLINICAL SOCIAL WORK MATERNAL/CHILD NOTE  Patient Details  Name: Christina Freeman MRN: [id number] Date of Birth: 04-12-1995  Date:  06/03/2019  Clinical Social Worker Initiating Note:  Christina Freeman, MSW, LCSWA Date/Time: Initiated:  06/03/19/1000     Child's Name:  Christina Freeman   Biological Parents:  Mother, Father(Christina Freeman, 02/20/1991)   Need for Interpreter:  None   Reason for Referral:  Behavioral Health Concerns, Current Substance Use/Substance Use During Pregnancy , SI/HI/Psychosis   Address:  [place] [address]    Phone number:  [phone] (home)     Additional phone number: [phone]  Household Members/Support Persons (HM/SP):   Household Member/Support Person 1, Household Member/Support Person 2   HM/SP Name Relationship DOB or Age  HM/SP -1 Christina Freeman FOB 02/20/1991  HM/SP -2 Christina Freeman son 01/10/2017  HM/SP -3        HM/SP -4        HM/SP -5        HM/SP -6        HM/SP -7        HM/SP -8          Natural Supports (not living in the home):  Immediate Family, Parent, Extended Family   Professional Supports: Case Manager/Social Worker(Case Worker, [facility], [place], [phone], Left voice message for f/u)   Employment: Unemployed   Type of Work:     Education:  [place] arranged:    Museum/gallery curator Resources:  Multimedia programmer   Other Resources:  [facility]   Cultural/Religious Considerations Which May Impact Care:  none stated  Strengths:  Engineer, materials, Home prepared for child    Psychotropic Medications:         Pediatrician:    Solicitor area  Pediatrician List:   [facility] [place]      Pediatrician Fax Number:    Risk Factors/Current Problems:  Substance Use , Mental Health Concerns    Cognitive State:  Able to Concentrate , Alert    Mood/Affect:  Anxious ,  Tearful , Interested    CSW Assessment: CSW received consult for hx of Anxiety, Depression, suicide attempt, postpartum depression, THC use 10/2018, and cocaine use 2019.   CSW met with MOB at bedside  to offer support and complete assessment. FOB and infant Christina Freeman were present on arrival, however, after PPD/A and SIDS education, FOB stepped out to go sign birth certificate and offer MOB privacy during assessment. MOB and FOB were pleasant and engaged during visit.   MOB reported history of PTSD, bipolar disorder, depression, anxiety, postpartum depression, and one previous suicide attempt. MOB denied any current SI, HI, or domestic violence. MOB explained suicide attempt was related to postpartum depression and being on drugs in efforts to self-medicate. MOB reports sx during that time were feeling numb, taking out frustration on husband when getting upset about small things, and being angry. MOB reported since suicide-attempt hospitalization she has been doing much better. MOB reported she took Cymbalta and Hydroxyzine from time of discharge from hospital until she found out she was pregnant with this baby. MOB reported she declined to take medication during pregnacy for fear of harm to baby. MOB stated that even though she believes she has been stable throughout pregnancy, she is interested in restarting medication today as a preventative measure. CSW agreed to update RN  and did following this visit. MOB stated  breaks outside, going for walks with oldest child, talking with case manager Christina Freeman, and redirecting negative thoughts are her coping strategies.  MOB identified FOB, brother, dad, and FOB's parents as supports.   MOB reported history of cocaine and THC use. MOB reported last use cocaine July, 2019 and Christina Freeman September 2020. MOB reports no substance use since finding out she was pregnant. MOB reported stopping cocaine because of suicide attempt. MOB denies any cocaine use since. CSW informed MOB of  hospital infant drug screen policy, infant's negative UDS, pending CDS, and CPS report if warranted. MOB asked appropriate questions and denied any additional questions.  MOB declined any substance treatment resources.  CSW provided education regarding the baby blues period vs. perinatal mood disorders, discussed treatment and gave resources for mental health follow up if concerns arise.  CSW recommends self-evaluation during the postpartum time period using the New Mom Checklist from Postpartum Progress and encouraged MOB and FOB  to contact a medical professional if symptoms are noted at any time. MOB and FOB denied any questions.    CSW provided review of Sudden Infant Death Syndrome (SIDS) precautions. MOB confirmed having all needed items for baby including car seat and bassinet for safe sleeping.  MOB and FOB denied any questions.   MOB gave CSW permission to reachout to case worker Building services engineer through [employer]. CSW left voice message on Christina Freeman work phone provided by [facility] [phone]). CSW informed Christina Freeman MOB had delivered and would need f/u support.  MOB reports Christina Freeman has been providing supportive counseling weekly and connects her with resources as needed. CSW also provided MOB and FOB with list of emergency and nonemergency behavior health support. CSW identifies no further need for intervention and no barriers to discharge at this time.  CSW Plan/Description:  No Further Intervention Required/No Barriers to Discharge, Sudden Infant Death Syndrome (SIDS) Education, Perinatal Mood and Anxiety Disorder (PMADs) Education, [place], CSW Will Continue to Monitor Umbilical Cord Tissue Drug Screen Results and Make Report if Warranted   Christina Freeman D. Lissa Morales, MSW, [facility] Clinical Social Worker [phone] 06/03/2019, 4:23 PM

## 2019-06-03 NOTE — Lactation Note (Signed)
This note was copied from a baby's chart. Lactation Consultation Note Baby is 70 hrs old. Mom stated baby is BF well. LC saw a latch mom doing good.  Newborn behavior, STS, I&O, breast massage, positioning, support, feeding habits, supply and demand discussed. Mom hand expressed colostrum. Praised mom.  Mom encouraged to feed baby 8-12 times/24 hours and with feeding cues. Mom encouraged to waken baby for feedings if hasn't cued in 3 hrs.  Encouraged mom to call for assistance or questions. Mom feels feedings are going well.  Patient Name: Christina Freeman [id number] Date: 06/03/2019 Reason for consult: Initial assessment;Primapara;Term   Maternal Data Has patient been taught Hand Expression?: Yes Does the patient have breastfeeding experience prior to this delivery?: No  Feeding Feeding Type: Breast Fed  LATCH Score Latch: Grasps breast easily, tongue down, lips flanged, rhythmical sucking.  Audible Swallowing: A few with stimulation  Type of Nipple: Everted at rest and after stimulation  Comfort (Breast/Nipple): Soft / non-tender  Hold (Positioning): No assistance needed to correctly position infant at breast.  LATCH Score: 9  Interventions Interventions: Breast feeding basics reviewed;Support pillows;Position options;Breast massage;Hand express;Breast compression;Adjust position  Lactation Tools Discussed/Used WIC Program: Yes   Consult Status Consult Status: Follow-up Date: 06/04/19 Follow-up type: In-patient    Charyl Dancer 06/03/2019, 4:51 AM

## 2019-06-03 NOTE — Progress Notes (Signed)
Patient requesting to restart pre pregnancy medications for depression and anxiety. Patient with significant psych history including severe postpartum depression after her first delivery and a suicide attempt at 8 months postpartum. Consulted with Dr. Macon Large and will start cymbalta 60mg  daily and 25mg  vistaril. Patient informed that she needs to follow up ASAP with psychiatrist upon discharge.   , CNM 06/03/19 2:23 PM

## 2019-06-03 NOTE — Lactation Note (Signed)
This note was copied from a baby's chart. Lactation Consultation Note Attempted to see mom but she was sleeping.  Patient Name: Christina Freeman [id number]'[id number] Date: 06/03/2019     Maternal Data    Feeding    LATCH Score                   Interventions    Lactation Tools Discussed/Used     Consult Status      Charyl Dancer 06/03/2019, 3:27 AM

## 2019-06-04 ENCOUNTER — Encounter: Payer: Self-pay | Admitting: *Deleted

## 2019-06-04 DIAGNOSIS — F419 Anxiety disorder, unspecified: Secondary | ICD-10-CM

## 2019-06-04 MED ORDER — IBUPROFEN 800 MG PO TABS: 800.0000 mg | ORAL_TABLET | Freq: Three times a day (TID) | ORAL | 0 refills | Status: AC

## 2019-06-04 MED ORDER — HYDROXYZINE HCL 25 MG PO TABS: 25.0000 mg | ORAL_TABLET | Freq: Three times a day (TID) | ORAL | 0 refills | Status: AC | PRN

## 2019-06-04 MED ORDER — DULOXETINE HCL 60 MG PO CPEP: 60.0000 mg | ORAL_CAPSULE | Freq: Every day | ORAL | 0 refills | Status: AC

## 2019-06-04 MED ORDER — OXYCODONE HCL 5 MG PO TABS: 5.0000 mg | ORAL_TABLET | ORAL | 0 refills | Status: AC | PRN

## 2019-06-04 MED ORDER — SENNOSIDES-DOCUSATE SODIUM 8.6-50 MG PO TABS: 2.0000 | ORAL_TABLET | ORAL | 0 refills | Status: AC

## 2019-06-04 MED FILL — IBUPROFEN 800 MG TAB: 800 | 10 days supply | Qty: 30 | Fill #0

## 2019-06-04 MED FILL — DULoxetine HCL 60 MG CPEP: 60 | 30 days supply | Qty: 30 | Fill #0

## 2019-06-04 MED FILL — oxyCODONE HCL 5 MG TABS: 5 | 4 days supply | Qty: 22 | Fill #0

## 2019-06-04 MED FILL — SENEXON-S 8.6-50 MG TABS: 8.6-50 | 15 days supply | Qty: 30 | Fill #0

## 2019-06-04 MED FILL — hydrOXYzine HCL 25 MG TABS: 25 | 20 days supply | Qty: 60 | Fill #0

## 2019-06-04 NOTE — Progress Notes (Signed)
This CSW received call from [facility]a with [facility]. CSW was advised that follow up care for MOB has already been scheduled with Ella. CSW made aware that MOB is enrolled in program with [facility] at this time.     Veora Fonte S. Kamren Heintzelman, MSW, LCSW [facility] [phone]  

## 2019-06-04 NOTE — Lactation Note (Signed)
This note was copied from a baby's chart. Lactation Consultation Note: infant is at 8 % wt loss.  and is 75 hours old.  Mother reports that her nurse gave her coconut oil for cracked sore nipples.   Mother reports that she thinks that infant is chewing in her nipple. Observed that her left nipple has a crack. Mother reports that nipple has been bleeding. Mother was assist with hand expression and placed comfort gel over nipple.   Assist mother to properly latch infant with wide open . Mother reports that she felt pain scale of 5 when infant latched. Assist with fliping infants tip lip and adjusting infants lower jaw for wider gape.   Infant sustained latch for 15 mins . Observed intermitent swallows.  Observed that nipple was round and slightly pink when infant released the breast.  Infant was fed 5 ml of ebm with a spoon.  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  Information given to MD. For follow up.   Assist mother to latch infant on the alternate breast. Infant sustained latch for 10=15 mins with audible swallowing.  Discussed treatment and prevention of engorgement.  Mother was given a hand pump with instructions . Mother to continue to cue base feed infant and feed at least 8-12 times or more in 24 hours and advised to allow for cluster feeding infant as needed.   Mother to continue to due STS. Mother is aware of available LC services at [facility], [facility]'S, OP Dept, and phone # for questions or concerns about breastfeeding.  Mother receptive to all teaching and plan of care.     Patient Name: Christina Freeman [id number] Date: 06/04/2019 Reason for consult: Follow-up assessment   Maternal Data    Feeding Feeding Type: Breast Fed  LATCH Score                   Interventions    Lactation Tools Discussed/Used     Consult Status      Christina Freeman 06/04/2019, 8:40 AM

## 2019-06-09 ENCOUNTER — Inpatient Hospital Stay (HOSPITAL_COMMUNITY): Admit: 2019-06-09 | Payer: Self-pay

## 2019-07-17 DIAGNOSIS — N760 Acute vaginitis: Secondary | ICD-10-CM | POA: Diagnosis not present

## 2019-11-24 IMAGING — US US MFM FETAL NUCHAL TRANSLUCENCY
1 series · 14 of 28 positions shown · non-contrast
Comparison: none

[Series 1: us mfm fetal nuchal translucency · 14 of 50 slices shown]
[im 2/50]
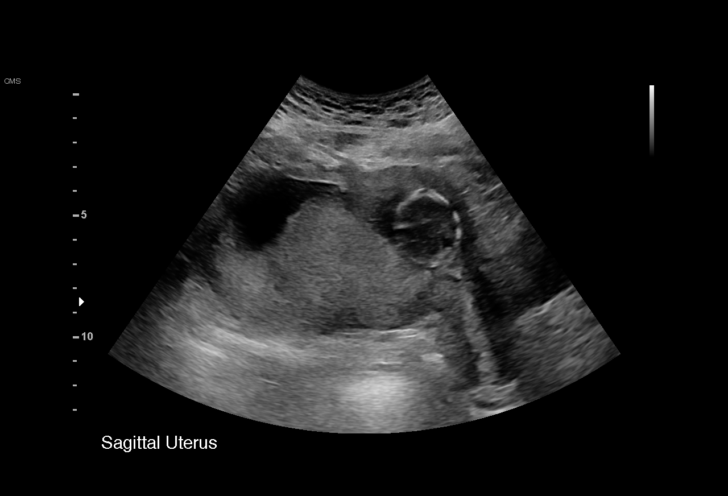
[im 6/50]
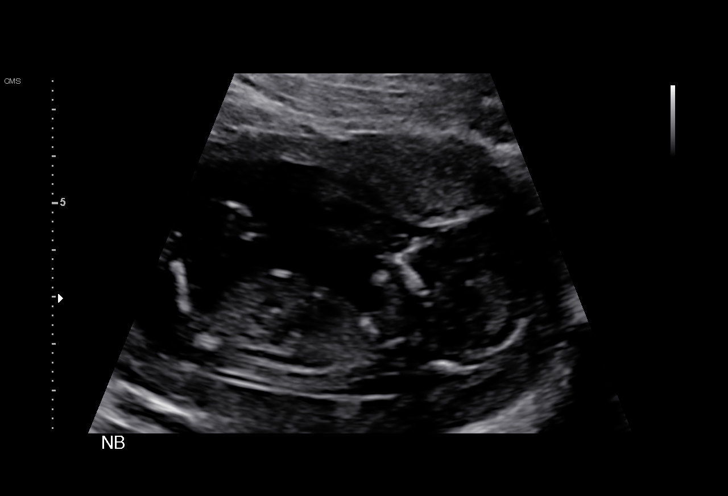
[im 10/50]
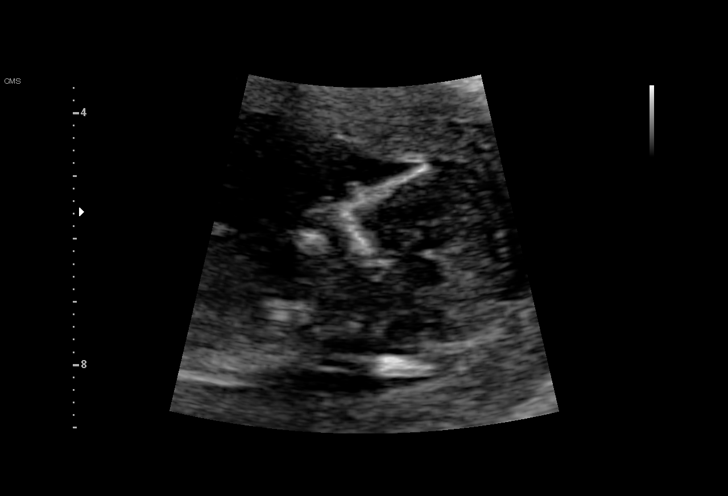
[im 13/50]
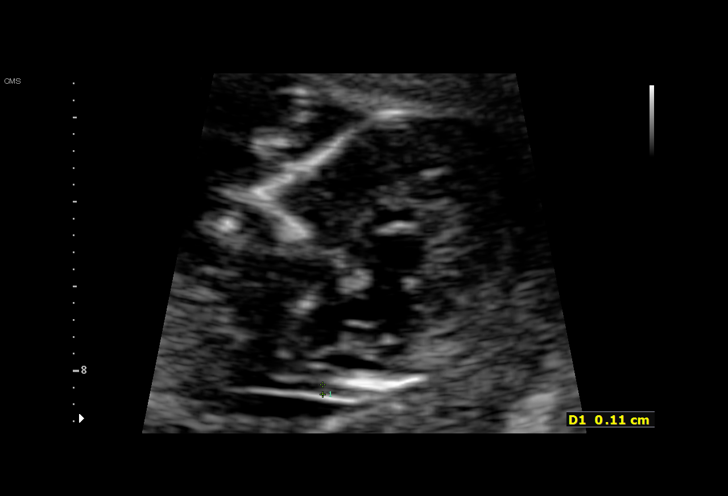
[im 17/50]
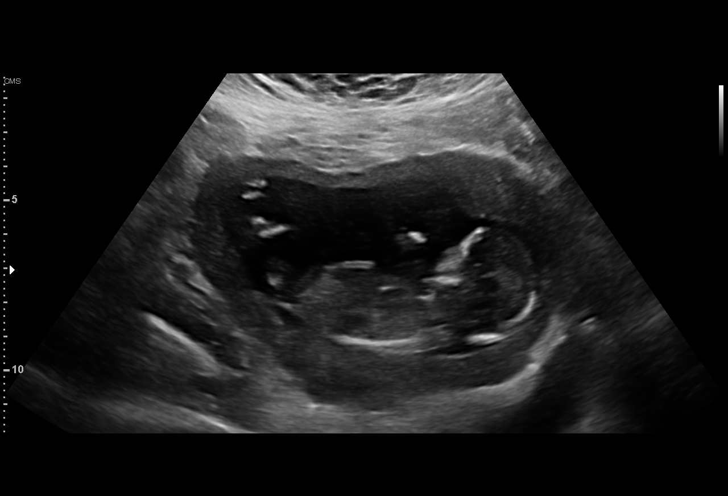
[im 20/50]
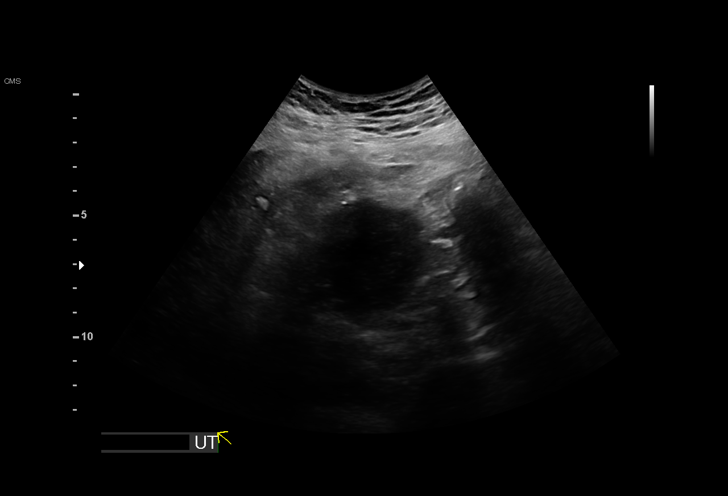
[im 24/50]
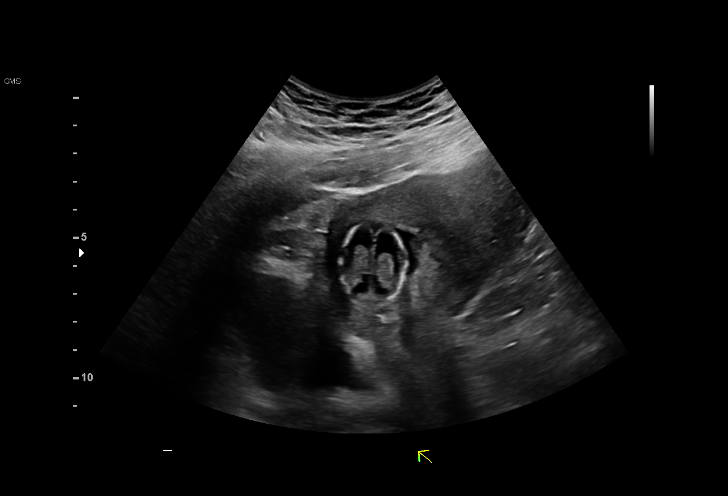
[im 28/50]
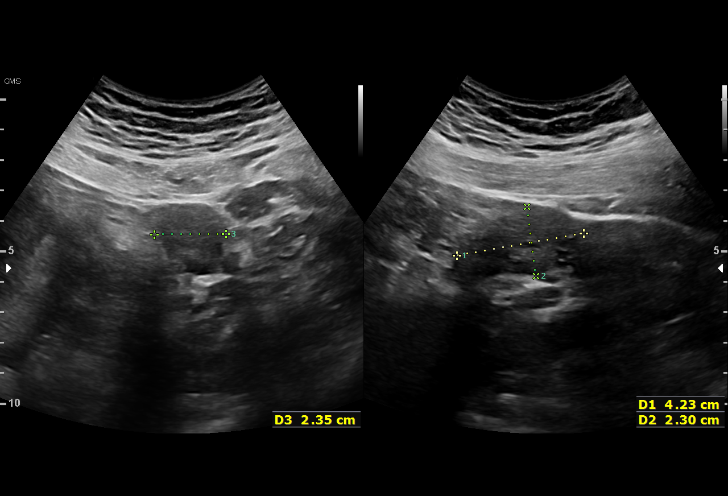
[im 31/50]
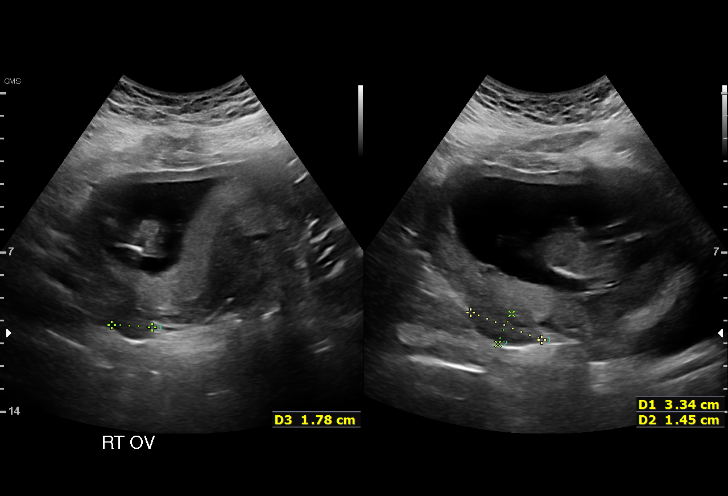
[im 35/50]
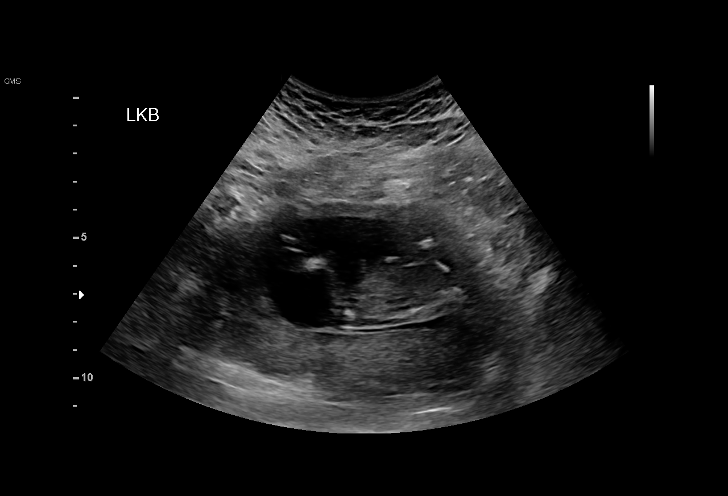
[im 39/50]
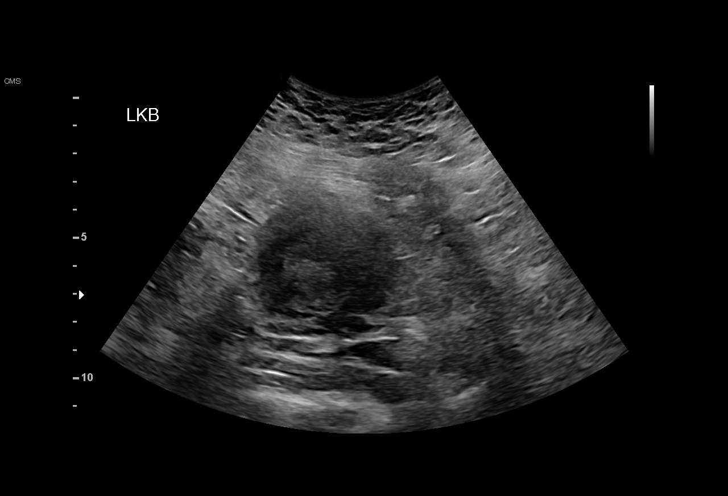
[im 42/50]
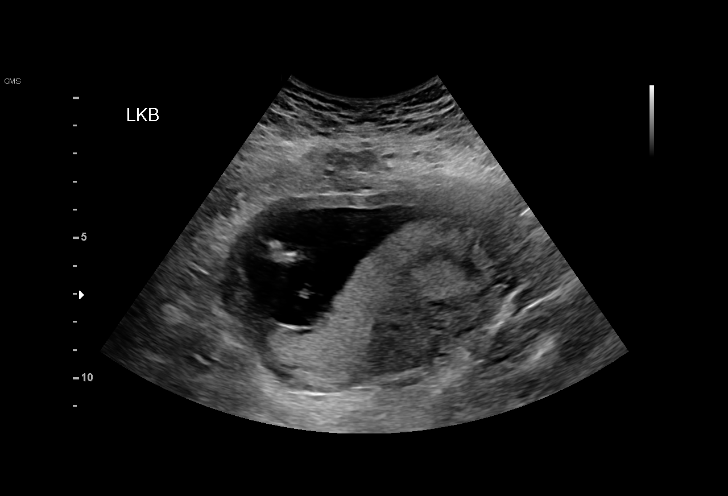
[im 46/50]
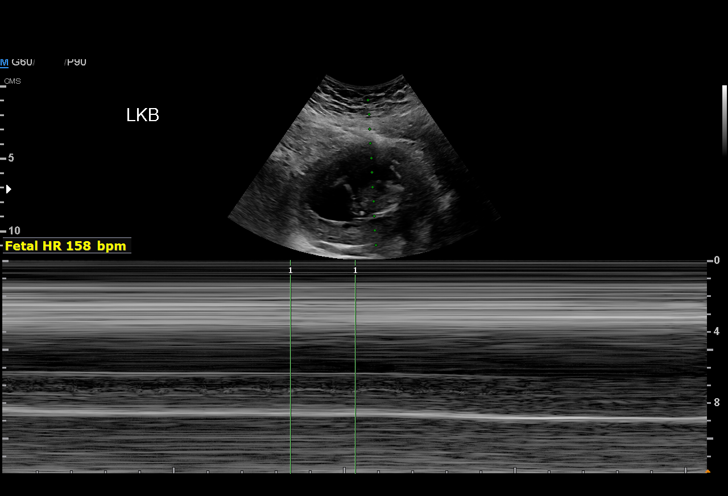
[im 50/50]
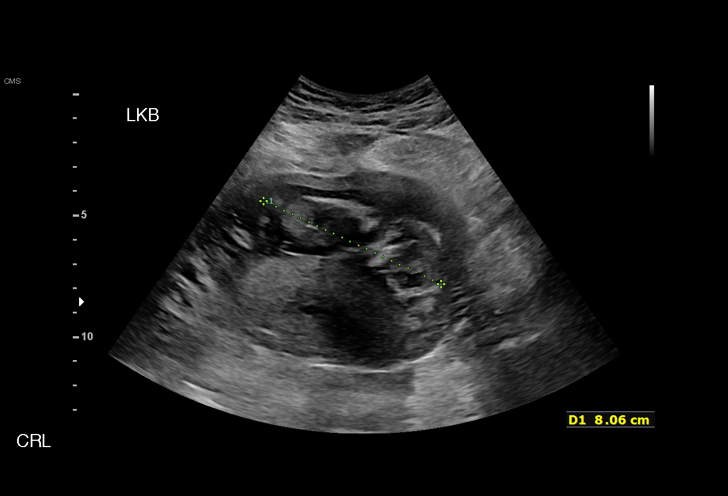

[14 of 28 positions shown; findings below may reference images not displayed]

[REDACTED]-
                   Faculty Physician

     TRANSLUCENCY                                      DANIEL T
 ----------------------------------------------------------------------

 ----------------------------------------------------------------------
Indications

  Obesity complicating pregnancy, first
  trimester
  Encounter for nuchal translucency
  Poor obstetric history: Previous gestational
  HTN
  Previous cesarean delivery, antepartum
  13 weeks gestation of pregnancy
 ----------------------------------------------------------------------
Fetal Evaluation

 Num Of Fetuses:         1
 Preg. Location:         Intrauterine
 Gest. Sac:              Intrauterine
 Fetal Pole:             Visualized
 Fetal Heart Rate(bpm):  158
 Cardiac Activity:       Observed
Biometry

 CRL:      82.9  mm     G. Age:  13w 6d                  EDD:   06/09/19
OB History

 Gravidity:    4         Term:   1        Prem:   0        SAB:   2
 TOP:          0       Ectopic:  0        Living: 1
Gestational Age
 LMP:           13w 6d        Date:  09/02/18                 EDD:   06/09/19
 Best:          13w 6d     Det. By:  LMP  (09/02/18)          EDD:   06/09/19
1st Trimester Genetic Sonogram Screening

 CRL:            82.9  mm    G. Age:   13w 6d                 EDD:   06/09/19
 Nuc Trans:       1.6  mm
Anatomy

 Choroid Plexus:        Appears normal         Upper Extremities:      Visualized
 Stomach:               Appears normal, left   Lower Extremities:      Visualized
                        sided
 Bladder:               Appears normal
Cervix Uterus Adnexa

 Cervix
 Closed

 Uterus
 No abnormality visualized.

 Left Ovary
 Within normal limits.

 Right Ovary
 Within normal limits.

 Cul De Sac
 No free fluid seen.

 Adnexa
 No abnormality visualized.
Comments

 This patient was seen for a first trimester nuchal translucency
 measurement.

 The crown-rump length measured today is consistent with her
 gestational age.

 The nuchal translucency measurement was 1.6 mm, which is
 within normal limits.

 The patient will have blood work drawn following today's
 ultrasound exam for a first trimester screening test.

 She should have a fetal anatomy scan performed at around
 19 weeks.

## 2020-03-23 ENCOUNTER — Emergency Department (INDEPENDENT_AMBULATORY_CARE_PROVIDER_SITE_OTHER)
Admission: EM | Admit: 2020-03-23 | Discharge: 2020-03-23 | Disposition: A | Discharge status: Home / Self Care | Payer: 59 | Source: Home / Self Care | Attending: Family Medicine | Admitting: Family Medicine

## 2020-03-23 ENCOUNTER — Other Ambulatory Visit: Payer: Self-pay

## 2020-03-23 DIAGNOSIS — S61412A Laceration without foreign body of left hand, initial encounter: Secondary | ICD-10-CM | POA: Diagnosis not present

## 2020-03-23 NOTE — ED Triage Notes (Signed)
Pt states that her hand went through a window she was knocking on. Pt has laceration to left hand.

## 2020-03-23 NOTE — Discharge Instructions (Addendum)
Elevate hand to reduce swelling Limit use of hand for the next 2 to 3 days Watch for signs of infection, redness, increased pain, swelling, pus Stitches need to come out in 10 days

## 2020-03-24 DIAGNOSIS — S61412A Laceration without foreign body of left hand, initial encounter: Secondary | ICD-10-CM | POA: Diagnosis not present
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
## 2020-04-14 DIAGNOSIS — Z20822 Contact with and (suspected) exposure to covid-19: Secondary | ICD-10-CM | POA: Diagnosis not present

## 2020-04-14 DIAGNOSIS — Z03818 Encounter for observation for suspected exposure to other biological agents ruled out: Secondary | ICD-10-CM | POA: Diagnosis not present

## 2020-04-15 DIAGNOSIS — R198 Other specified symptoms and signs involving the digestive system and abdomen: Secondary | ICD-10-CM | POA: Diagnosis not present

## 2020-04-15 DIAGNOSIS — R1114 Bilious vomiting: Secondary | ICD-10-CM | POA: Diagnosis not present

## 2020-04-15 DIAGNOSIS — K642 Third degree hemorrhoids: Secondary | ICD-10-CM | POA: Diagnosis not present

## 2020-04-22 DIAGNOSIS — K642 Third degree hemorrhoids: Secondary | ICD-10-CM | POA: Diagnosis not present

## 2020-06-26 DIAGNOSIS — M79641 Pain in right hand: Secondary | ICD-10-CM | POA: Diagnosis not present

## 2020-06-26 DIAGNOSIS — Z975 Presence of (intrauterine) contraceptive device: Secondary | ICD-10-CM | POA: Diagnosis not present

## 2020-06-26 DIAGNOSIS — Z79899 Other long term (current) drug therapy: Secondary | ICD-10-CM | POA: Diagnosis not present

## 2020-06-26 DIAGNOSIS — G8911 Acute pain due to trauma: Secondary | ICD-10-CM | POA: Diagnosis not present

## 2020-06-26 DIAGNOSIS — Z87891 Personal history of nicotine dependence: Secondary | ICD-10-CM | POA: Diagnosis not present

## 2020-06-26 DIAGNOSIS — S6991XA Unspecified injury of right wrist, hand and finger(s), initial encounter: Secondary | ICD-10-CM | POA: Diagnosis not present

## 2020-12-10 DIAGNOSIS — H5213 Myopia, bilateral: Secondary | ICD-10-CM | POA: Diagnosis not present
